# Patient Record
Sex: Female | Born: 1995 | Race: White | Hispanic: No | Smoking: Never smoker
Health system: Southern US, Community
[De-identification: ages and names within clinical notes are randomized; demographics above are authoritative.]

## PROBLEM LIST (undated history)

## (undated) DIAGNOSIS — F419 Anxiety disorder, unspecified: Secondary | ICD-10-CM

## (undated) DIAGNOSIS — M549 Dorsalgia, unspecified: Secondary | ICD-10-CM

## (undated) DIAGNOSIS — K589 Irritable bowel syndrome without diarrhea: Secondary | ICD-10-CM

## (undated) DIAGNOSIS — E079 Disorder of thyroid, unspecified: Secondary | ICD-10-CM

## (undated) DIAGNOSIS — I10 Essential (primary) hypertension: Secondary | ICD-10-CM

## (undated) DIAGNOSIS — K219 Gastro-esophageal reflux disease without esophagitis: Secondary | ICD-10-CM

## (undated) DIAGNOSIS — G4730 Sleep apnea, unspecified: Secondary | ICD-10-CM

## (undated) DIAGNOSIS — F32A Depression, unspecified: Secondary | ICD-10-CM

## (undated) DIAGNOSIS — R0602 Shortness of breath: Secondary | ICD-10-CM

## (undated) DIAGNOSIS — R5383 Other fatigue: Secondary | ICD-10-CM

## (undated) HISTORY — DX: Dorsalgia, unspecified: M54.9

## (undated) HISTORY — DX: Irritable bowel syndrome, unspecified: K58.9

## (undated) HISTORY — DX: Shortness of breath: R06.02

## (undated) HISTORY — DX: Essential (primary) hypertension: I10

## (undated) HISTORY — DX: Other fatigue: R53.83

## (undated) HISTORY — DX: Sleep apnea, unspecified: G47.30

## (undated) HISTORY — DX: Depression, unspecified: F32.A

## (undated) HISTORY — DX: Anxiety disorder, unspecified: F41.9

## (undated) HISTORY — DX: Gastro-esophageal reflux disease without esophagitis: K21.9

## (undated) HISTORY — DX: Disorder of thyroid, unspecified: E07.9

---
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
## 2013-07-13 ENCOUNTER — Ambulatory Visit (HOSPITAL_COMMUNITY)
Admission: RE | Admit: 2013-07-13 | Discharge: 2013-07-13 | Disposition: A | Discharge status: Home / Self Care | Payer: BC Managed Care – PPO | Source: Ambulatory Visit | Attending: Family Medicine | Admitting: Family Medicine

## 2013-07-13 ENCOUNTER — Ambulatory Visit (INDEPENDENT_AMBULATORY_CARE_PROVIDER_SITE_OTHER): Payer: BC Managed Care – PPO | Admitting: Family Medicine

## 2013-07-13 VITALS — BP 130/84 | HR 74 | Temp 98.0°F | Resp 16 | Ht 65.5 in | Wt 242.0 lb

## 2013-07-13 DIAGNOSIS — R0602 Shortness of breath: Secondary | ICD-10-CM | POA: Insufficient documentation

## 2013-07-13 DIAGNOSIS — R109 Unspecified abdominal pain: Secondary | ICD-10-CM

## 2013-07-13 DIAGNOSIS — R1033 Periumbilical pain: Secondary | ICD-10-CM | POA: Insufficient documentation

## 2013-07-13 LAB — POCT CBC
Granulocyte percent: 61.7 %G (ref 37–80)
HCT, POC: 46.4 % (ref 37.7–47.9)
Hemoglobin: 14.6 g/dL (ref 12.2–16.2)
Lymph, poc: 3.1 (ref 0.6–3.4)
MCH, POC: 29.6 pg (ref 27–31.2)
MCHC: 31.5 g/dL — AB (ref 31.8–35.4)
MCV: 94.2 fL (ref 80–97)
MID (CBC): 0.5 (ref 0–0.9)
MPV: 8 fL (ref 0–99.8)
PLATELET COUNT, POC: 333 10*3/uL (ref 142–424)
POC GRANULOCYTE: 5.8 (ref 2–6.9)
POC LYMPH %: 33.5 % (ref 10–50)
POC MID %: 4.8 %M (ref 0–12)
RBC: 4.93 M/uL (ref 4.04–5.48)
RDW, POC: 13.8 %
WBC: 9.4 10*3/uL (ref 4.6–10.2)

## 2013-07-13 LAB — POCT UA - MICROSCOPIC ONLY
Casts, Ur, LPF, POC: NEGATIVE
Crystals, Ur, HPF, POC: NEGATIVE
MUCUS UA: NEGATIVE
Yeast, UA: NEGATIVE

## 2013-07-13 LAB — POCT URINALYSIS DIPSTICK
BILIRUBIN UA: NEGATIVE
Glucose, UA: NEGATIVE
KETONES UA: NEGATIVE
LEUKOCYTES UA: NEGATIVE
Nitrite, UA: NEGATIVE
PH UA: 5.5
Protein, UA: NEGATIVE
Spec Grav, UA: >=1.03
Urobilinogen, UA: 0.2

## 2013-07-13 LAB — POCT URINE PREGNANCY: Preg Test, Ur: NEGATIVE

## 2013-07-13 MED ORDER — CYCLOBENZAPRINE HCL 5 MG PO TABS: 5.0000 mg | ORAL_TABLET | Freq: Three times a day (TID) | ORAL | Status: DC | PRN

## 2013-07-13 MED ORDER — IOHEXOL 300 MG/ML  SOLN
100.0000 mL | Freq: Once | INTRAMUSCULAR | Status: AC | PRN
  Administered 2013-07-13: 100 mL via INTRAVENOUS

## 2013-07-13 MED ORDER — HYDROCODONE-ACETAMINOPHEN 5-325 MG PO TABS: 1.0000 | ORAL_TABLET | Freq: Three times a day (TID) | ORAL | Status: DC | PRN

## 2013-07-13 NOTE — Addendum Note (Signed)
Addended by: Ethelda ChickSMITH, Jaala Bohle M on: 07/13/2013 09:07 PM   Modules accepted: Orders

## 2013-07-13 NOTE — Addendum Note (Signed)
Addended by: Ethelda ChickSMITH, Aliana Kreischer M on: 07/13/2013 09:10 PM   Modules accepted: Orders

## 2013-07-13 NOTE — Patient Instructions (Signed)
[facility] at Emergency Department as an OUTPATIENT CT. Make sure you tell them at the desk register for OUTPATIENT CT

## 2013-07-13 NOTE — Progress Notes (Signed)
Subjective:    Patient ID: Lori Clements, female    DOB: 10/14/1995, 18 y.o.   MRN: [id number]  Abdominal Pain Pertinent negatives include no constipation, diarrhea, dysuria, fever, frequency, hematuria, nausea or vomiting.   This 18 y.o. female presents for evaluation of abdominal pain.  No fever/chills/sweats.  No n/v/d/c.  No bloody stools; no melena.  +chronic heartburn; no worsening; no medications.  Eating has no affect on pain.  Appetite good.  No dysuria, frequency, hematuria.  Sporadic menses due to Nexplanon.  No similar pain in past.  Movement makes worse.  If jeans hit it; bending over makes worse; rolling over makes worse.  No medications for pain. Evaluated by school nurse today; no evidence of skin infection; nurse recommended Urgent Care evaluation.  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  Taking deep breath makes abdominal pain worse; pain located right at umbilicus.  No b.m. Today; last b.m. Yesterday and normal.    PCP: [facility]  Review of Systems  Constitutional: Negative for fever, chills and diaphoresis.  Gastrointestinal: Positive for abdominal pain. Negative for nausea, vomiting, diarrhea, constipation, blood in stool, anal bleeding and rectal pain.  Genitourinary: Negative for dysuria, urgency, frequency, hematuria and flank pain.  Skin: Negative for color change and rash.   Past Medical History  Diagnosis Date  . Thyroid disease    History reviewed. No pertinent past surgical history. Allergies  Allergen Reactions  . Naproxen     hematuria   No current outpatient prescriptions on file prior to visit.   No current facility-administered medications on file prior to visit.   History   Social History  . Marital Status: Single    Spouse Name: N/A    Number of Children: N/A  . Years of Education: N/A   Occupational History  . Not on file.   Social History Main Topics  . Smoking status: Never  Smoker   . Smokeless tobacco: Not on file  . Alcohol Use: No  . Drug Use: No  . Sexual Activity: Yes   Other Topics Concern  . Not on file   Social History Narrative  . No narrative on file       Objective:   Physical Exam  Nursing note and vitals reviewed. Constitutional: She appears well-developed and well-nourished. No distress.  obese  HENT:  Head: Normocephalic and atraumatic.  Neck: Normal range of motion. Neck supple.  Cardiovascular: Normal rate, regular rhythm and normal heart sounds.   Pulmonary/Chest: Effort normal and breath sounds normal.  Abdominal: Soft. Bowel sounds are normal. She exhibits no distension and no mass. There is no hepatosplenomegaly. There is tenderness in the periumbilical area. There is guarding. There is no rebound and no CVA tenderness. No hernia.    Exam limited due to pain.  Deep palpation of area not completed.  Skin: She is not diaphoretic. There is erythema.  +mild erythema 2 cm diameter superior to umbilicus without fluctuants, induration or skin breakdown; no vesicles or pustules.  No abnormality to skin in umbilicus.  Psychiatric: She has a normal mood and affect. Her behavior is normal.   Results for orders placed in visit on 07/13/13  POCT UA - MICROSCOPIC ONLY      Result Value Range   WBC, Ur, HPF, POC 4-5     RBC, urine, microscopic 6-11     Bacteria, U Microscopic 4+     Mucus, UA neg     Epithelial  cells, urine per micros 10-21     Crystals, Ur, HPF, POC neg     Casts, Ur, LPF, POC neg     Yeast, UA neg    POCT URINALYSIS DIPSTICK      Result Value Range   Color, UA yellow     Clarity, UA clear     Glucose, UA neg     Bilirubin, UA neg     Ketones, UA neg     Spec Grav, UA >=1.030     Blood, UA mod     pH, UA 5.5     Protein, UA neg     Urobilinogen, UA 0.2     Nitrite, UA neg     Leukocytes, UA Negative    POCT CBC      Result Value Range   WBC 9.4  4.6 - 10.2 K/uL   Lymph, poc 3.1  0.6 - 3.4   POC LYMPH  PERCENT 33.5  10 - 50 %L   MID (cbc) 0.5  0 - 0.9   POC MID % 4.8  0 - 12 %M   POC Granulocyte 5.8  2 - 6.9   Granulocyte percent 61.7  37 - 80 %G   RBC 4.93  4.04 - 5.48 M/uL   Hemoglobin 14.6  12.2 - 16.2 g/dL   HCT, POC 16.1  09.6 - 47.9 %   MCV 94.2  80 - 97 fL   MCH, POC 29.6  27 - 31.2 pg   MCHC 31.5 (*) 31.8 - 35.4 g/dL   RDW, POC 04.5     Platelet Count, POC 333  142 - 424 K/uL   MPV 8.0  0 - 99.8 fL  POCT URINE PREGNANCY      Result Value Range   Preg Test, Ur Negative         Assessment & Plan:  Abdominal pain - Plan: POCT UA - Microscopic Only, POCT urinalysis dipstick, POCT CBC, POCT urine pregnancy, CT Abdomen Pelvis W Contrast  1.  Abdominal pain periumbilical:  New. Ddx hernia, early appendicitis, cellulitis/abscess.  Obtain CT abdomen/pelvis due to severity of pain.    Nilda Simmer, M.D.  [facility] [address] [place], [place]  [address] [phone] phone [phone] fax

## 2013-07-14 ENCOUNTER — Ambulatory Visit: Payer: BC Managed Care – PPO | Admitting: Family Medicine

## 2013-07-14 VITALS — BP 110/70 | HR 65 | Temp 98.5°F | Resp 16 | Ht 65.0 in | Wt 242.0 lb

## 2013-07-14 DIAGNOSIS — L0291 Cutaneous abscess, unspecified: Secondary | ICD-10-CM

## 2013-07-14 DIAGNOSIS — L0390 Cellulitis, unspecified: Secondary | ICD-10-CM

## 2013-07-14 DIAGNOSIS — R1033 Periumbilical pain: Secondary | ICD-10-CM

## 2013-07-14 MED ORDER — SULFAMETHOXAZOLE-TMP DS 800-160 MG PO TABS: 1.0000 | ORAL_TABLET | Freq: Two times a day (BID) | ORAL | Status: DC

## 2013-07-14 MED ORDER — CEPHALEXIN 500 MG PO CAPS: 500.0000 mg | ORAL_CAPSULE | Freq: Three times a day (TID) | ORAL | Status: DC

## 2013-07-14 NOTE — Patient Instructions (Signed)
1.  Return for increasing pain, redness, swelling, or fever > 100.5 (chills, sweats).

## 2013-07-14 NOTE — Progress Notes (Signed)
Subjective:    Patient ID: Lori Clements, female    DOB: [birth year], 18 y.o.   MRN: [id number]  HPI This chart was scribed for Nakiesha Rumsey-MD, by Ladona Ridgelaylor Day, Scribe. This patient was seen in room 12 and the patient's care was started at 8:23 AM.  HPI Comments: Lori Clements is a 18 y.o. female who [facility] the [facility] for 24 hour follow up of her abdominal pain after she was seen yesterday for same. She is status post abdominal CT which was negative for abscess, appendicitis or hernia; no other abnormalities. She was advised to take Tylenol for pain and to follow up today. She also had a normal CBC and negative urine pregnancy test; u/a was negative.   She reports using melatonin and Tylenol to help her sleep last night. She reports abdominal pain is same as yesterday. She states rolling over in bed aggravates her pain. She denies any nausea, emesis episodes, diarrhea, fever/chills or sweats. She did not have a bm since yesterday AM, no black or bloody stools. She reports a normal appetite this AM. She states redness over area of her abdomen where she previously had pain is unchanged.  There are no active problems to display for this patient.  History reviewed. No pertinent past surgical history.  History reviewed. No pertinent family history.  History   Social History  . Marital Status: Single    Spouse Name: N/A    Number of Children: N/A  . Years of Education: N/A   Occupational History  . Not on file.   Social History Main Topics  . Smoking status: Never Smoker   . Smokeless tobacco: Not on file  . Alcohol Use: No  . Drug Use: No  . Sexual Activity: Yes   Other Topics Concern  . Not on file   Social History Narrative  . No narrative on file   Allergies  Allergen Reactions  . Naproxen     hematuria   Results for orders placed in visit on 07/13/13  POCT UA - MICROSCOPIC ONLY      Result Value Range   WBC, Ur, HPF, POC 4-5     RBC,  urine, microscopic 6-11     Bacteria, U Microscopic 4+     Mucus, UA neg     Epithelial cells, urine per micros 10-21     Crystals, Ur, HPF, POC neg     Casts, Ur, LPF, POC neg     Yeast, UA neg    POCT URINALYSIS DIPSTICK      Result Value Range   Color, UA yellow     Clarity, UA clear     Glucose, UA neg     Bilirubin, UA neg     Ketones, UA neg     Spec Grav, UA >=1.030     Blood, UA mod     pH, UA 5.5     Protein, UA neg     Urobilinogen, UA 0.2     Nitrite, UA neg     Leukocytes, UA Negative    POCT CBC      Result Value Range   WBC 9.4  4.6 - 10.2 K/uL   Lymph, poc 3.1  0.6 - 3.4   POC LYMPH PERCENT 33.5  10 - 50 %L   MID (cbc) 0.5  0 - 0.9   POC MID % 4.8  0 - 12 %M   POC Granulocyte 5.8  2 - 6.9  Granulocyte percent 61.7  37 - 80 %G   RBC 4.93  4.04 - 5.48 M/uL   Hemoglobin 14.6  12.2 - 16.2 g/dL   HCT, POC 21.3  08.6 - 47.9 %   MCV 94.2  80 - 97 fL   MCH, POC 29.6  27 - 31.2 pg   MCHC 31.5 (*) 31.8 - 35.4 g/dL   RDW, POC 57.8     Platelet Count, POC 333  142 - 424 K/uL   MPV 8.0  0 - 99.8 fL  POCT URINE PREGNANCY      Result Value Range   Preg Test, Ur Negative     Review of Systems  Constitutional: Negative for fever and chills.  Respiratory: Negative for cough and shortness of breath.   Cardiovascular: Negative for chest pain.  Gastrointestinal: Positive for abdominal pain. Negative for nausea, vomiting, diarrhea, constipation and blood in stool.  Musculoskeletal: Negative for back pain.      Objective:   Physical Exam  Nursing note and vitals reviewed. Constitutional: She is oriented to person, place, and time. She appears well-developed and well-nourished. No distress.  HENT:  Head: Normocephalic and atraumatic.  Eyes: Conjunctivae are normal. Right eye exhibits no discharge. Left eye exhibits no discharge.  Neck: Normal range of motion.  Cardiovascular: Normal rate.   Pulmonary/Chest: Effort normal. No respiratory distress.  Abdominal: Soft.  Bowel sounds are normal. She exhibits no distension and no mass. There is tenderness. There is guarding. There is no rebound.    +superficial TTP superior to umbilicus without induration, mass, fluctuants.    Musculoskeletal: Normal range of motion. She exhibits no edema.  Neurological: She is alert and oriented to person, place, and time.  Skin: Skin is warm and dry. There is erythema.  +2 cm erythema superior to umbilicus without induration, fluctuants.  Psychiatric: She has a normal mood and affect. Thought content normal.   Triage Vitals: BP 90/60  Pulse 65  Temp(Src) 98.5 F (36.9 C) (Oral)  Resp 16  Ht 5\' 5"  (1.651 m)  Wt 242 lb (109.77 kg)  BMI 40.27 kg/m2  SpO2 98%  LMP 07/11/2013  DIAGNOSTIC STUDIES: Oxygen Saturation is 98% on room air, normal by my interpretation.    COORDINATION OF CARE: At 825 AM Discussed treatment plan with patient which includes antibiotic therapy. Patient agrees.      Assessment & Plan:  Pain, abdominal, periumbilical  Cellulitis  1. Abdominal pain periumbilical:  Unchanged from yesterday; s/p CT negative for intraabdominal process.  Superficial tenderness suggestive of early cellulitis versus abscess.  Will treat with antibiotics. RTC immediately for fever, vomiting,diarrhea, worsening pain. 2. Cellulitis abdomen periumbilical: New. Rx for Keflex tid and Bactrim bid.  RTC for increasing redness, swelling, fever, pain.    I personally performed the services described in this documentation, which was scribed in my presence.  The recorded information has been reviewed and is accurate.  Nilda Simmer, M.D.  [facility] [address] [place], [place]  [address] [phone] phone [phone] fax

## 2013-12-29 ENCOUNTER — Ambulatory Visit: Payer: Self-pay

## 2018-07-01 ENCOUNTER — Ambulatory Visit (INDEPENDENT_AMBULATORY_CARE_PROVIDER_SITE_OTHER): Payer: BLUE CROSS/BLUE SHIELD | Admitting: Neurology

## 2018-07-01 ENCOUNTER — Encounter: Payer: Self-pay | Admitting: Neurology

## 2018-07-01 VITALS — BP 175/106 | HR 98 | Ht 66.0 in | Wt 260.0 lb

## 2018-07-01 DIAGNOSIS — R400 Somnolence: Secondary | ICD-10-CM

## 2018-07-01 DIAGNOSIS — R442 Other hallucinations: Secondary | ICD-10-CM

## 2018-07-01 DIAGNOSIS — R51 Headache: Secondary | ICD-10-CM

## 2018-07-01 DIAGNOSIS — Z6841 Body Mass Index (BMI) 40.0 and over, adult: Secondary | ICD-10-CM

## 2018-07-01 DIAGNOSIS — R351 Nocturia: Secondary | ICD-10-CM

## 2018-07-01 DIAGNOSIS — G478 Other sleep disorders: Secondary | ICD-10-CM | POA: Diagnosis not present

## 2018-07-01 DIAGNOSIS — R519 Headache, unspecified: Secondary | ICD-10-CM

## 2018-07-01 DIAGNOSIS — R0683 Snoring: Secondary | ICD-10-CM

## 2018-07-01 NOTE — Progress Notes (Signed)
Subjective:    Patient ID: Lori Clements is a 23 y.o. female.  HPI     Huston FoleySaima Shunte Senseney, MD, PhD [facility] [address], [address] [address] [place], [place] [address]  Dear Dr. Egbert GaribaldiSlatosky,   I saw your patient, Lori Clements, upon your kind request in my sleep clinic today for initial consultation of her sleep disorder, in particular, significant daytime somnolence. The patient is unaccompanied today. As you know, Lori Clements is a 23 year old right-handed woman with an underlying medical history of anxiety and morbid obesity with BMI of over 40, who reports excessive daytime somnolence for the past 2+ years. She remembers being sleepy in college. She sometimes does talk while in class. She has come very close to falling asleep at the wheel. Once a week she has to take a longer distance drive and has to pull over to take a nap. She has noticed that she starts dreaming and her and she has had vivid dreams and nightmares. She has had no cognitive hallucinations and that she feels like she started streaming before she is even sleep. She has had infrequent episodes of sleep paralysis in the past 2 years. She does not recall being sleepy child. She was easy to wake up in the mornings as she recalls. She tries to get at least 7 hours of sleep, her sleep schedule varies because of her work schedule. 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 I reviewed your office note from 06/12/2018, which you kindly included. She does report snoring. Epworth Sleepiness Scale score is 19 out of 24 today, fatigue score is 16 out of 63. She is married and lives with her husband, she has no children, she works at [employer]. She is a nonsmoker and drinks alcohol occasionally, maybe once a month or so, caffeine not daily, typically once or twice a week or so. Of note, she has been on sertraline, currently 75 mg daily. Bedtime is between 7 PM and 10 PM typically depending on her work schedule  and as late as 11. Rise time is as early as 3:30 AM or as late as knowing. She has 3 dogs and 2 cats in the household, dog sleep in their crates, cats are in the bedroom sometimes. She has a TV in the bedroom but does not utilize it. Her maternal grandmother has sleep apnea, there is no family history of narcolepsy. She denies telltale symptoms of cataplexy. Her husband has noticed her sleepiness, she has been told that she snores some. She had a colonoscopy recently and was not told that there were any issues with her breathing or oxygen level. Her weight has been fluctuating. Recently she was able to lose several pounds but gained most of it back. She has nocturia about once per average night and has had the occasional morning headache and a tendency towards migrainous headaches around her periods.  Her Past Medical History Is Significant For: Past Medical History:  Diagnosis Date  . Thyroid disease     Her Past Surgical History Is Significant For: No past surgical history on file.  Her Family History Is Significant For: No family history on file.  Her Social History Is Significant For: Social History   Socioeconomic History  . Marital status: Single    Spouse name: Not on file  . Number of children: Not on file  . Years of education: Not on file  . Highest education level: Not on file  Occupational History  .  Not on file  Social Needs  . Financial resource strain: Not on file  . Food insecurity:    Worry: Not on file    Inability: Not on file  . Transportation needs:    Medical: Not on file    Non-medical: Not on file  Tobacco Use  . Smoking status: Never Smoker  . Smokeless tobacco: Never Used  Substance and Sexual Activity  . Alcohol use: No  . Drug use: No  . Sexual activity: Yes  Lifestyle  . Physical activity:    Days per week: Not on file    Minutes per session: Not on file  . Stress: Not on file  Relationships  . Social connections:    Talks on phone: Not on  file    Gets together: Not on file    Attends religious service: Not on file    Active member of club or organization: Not on file    Attends meetings of clubs or organizations: Not on file    Relationship status: Not on file  Other Topics Concern  . Not on file  Social History Narrative  . Not on file    Her Allergies Are:  Allergies  Allergen Reactions  . Naproxen     hematuria  :   Her Current Medications Are:  Outpatient Encounter Medications as of 07/01/2018  Medication Sig  . etonogestrel-ethinyl estradiol (NUVARING) 0.12-0.015 MG/24HR vaginal ring Place 1 each vaginally every 28 (twenty-eight) days. Insert vaginally and leave in place for 3 consecutive weeks, then remove for 1 week.  Marland Kitchen RANITIDINE HCL PO Take by mouth.  . sertraline (ZOLOFT) 50 MG tablet Take 75 mg by mouth daily.  . [DISCONTINUED] cephALEXin (KEFLEX) 500 MG capsule Take 1 capsule (500 mg total) by mouth 3 (three) times daily.  . [DISCONTINUED] etonogestrel (IMPLANON) 68 MG IMPL implant Inject 1 each into the skin once.  . [DISCONTINUED] levothyroxine (SYNTHROID, LEVOTHROID) 50 MCG tablet Take 50 mcg by mouth daily before breakfast.  . [DISCONTINUED] sulfamethoxazole-trimethoprim (BACTRIM DS) 800-160 MG per tablet Take 1 tablet by mouth 2 (two) times daily.   No facility-administered encounter medications on file as of 07/01/2018.   :  Review of Systems:  Out of a complete 14 point review of systems, all are reviewed and negative with the exception of these symptoms as listed below: Review of Systems  Neurological:       Pt presents today to discuss her sleep. Pt does endorse snoring but has never had a sleep study. Pt is concerned about her excessive sleep.  Epworth Sleepiness Scale 0= would never doze 1= slight chance of dozing 2= moderate chance of dozing 3= high chance of dozing  Sitting and reading: 3 Watching TV: 2 Sitting inactive in a public place (ex. Theater or meeting): 2 As a passenger in  a car for an hour without a break: 3 Lying down to rest in the afternoon: 3 Sitting and talking to someone: 0 Sitting quietly after lunch (no alcohol): 3 In a car, while stopped in traffic: 3 Total: 19     Objective:  Neurological Exam  Physical Exam Physical Examination:   Vitals:   07/01/18 1314  BP: (!) 175/106  Pulse: 98    General Examination: The patient is a very pleasant 23 y.o. female in no acute distress. She appears well-developed and well-nourished and well groomed.   HEENT: Normocephalic, atraumatic, pupils are equal, round and reactive to light and accommodation. Extraocular tracking is good without limitation to  gaze excursion or nystagmus noted. Normal smooth pursuit is noted. Hearing is grossly intact. Tympanic membranes are clear bilaterally. Face is symmetric with normal facial animation and normal facial sensation. Speech is clear with no dysarthria noted. There is no hypophonia. There is no lip, neck/head, jaw or voice tremor. Neck is supple with full range of passive and active motion. There are no carotid bruits on auscultation. Oropharynx exam reveals: mild mouth dryness, good dental hygiene and mild to moderate airway crowding, due to tonsils of 2+, smaller airway entry. Mallampati is class I. Uvula is on the smaller side. Tongue protrudes centrally and palate elevates symmetrically. Neck circumference is 15-5/8 inches.  Chest: Clear to auscultation without wheezing, rhonchi or crackles noted.  Heart: S1+S2+0, regular and normal without murmurs, rubs or gallops noted.   Abdomen: Soft, non-tender and non-distended with normal bowel sounds appreciated on auscultation.  Extremities: There is no pitting edema in the distal lower extremities bilaterally.   Skin: Warm and dry without trophic changes noted.  Musculoskeletal: exam reveals no obvious joint deformities, tenderness or joint swelling or erythema.   Neurologically:  Mental status: The patient is  awake, alert and oriented in all 4 spheres. Her immediate and remote memory, attention, language skills and fund of knowledge are appropriate. There is no evidence of aphasia, agnosia, apraxia or anomia. Speech is clear with normal prosody and enunciation. Thought process is linear. Mood is normal and affect is normal.  Cranial nerves II - XII are as described above under HEENT exam. In addition: shoulder shrug is normal with equal shoulder height noted. Motor exam: Normal bulk, strength and tone is noted. There is no drift, tremor or rebound. Romberg is negative. Reflexes are 2+ throughout. Fine motor skills and coordination: intact with normal finger taps, normal hand movements, normal rapid alternating patting, normal foot taps and normal foot agility.  Cerebellar testing: No dysmetria or intention tremor on finger to nose testing. Heel to shin is unremarkable bilaterally. There is no truncal or gait ataxia.  Sensory exam: intact to light touch in the upper and lower extremities.  Gait, station and balance: She stands easily. No veering to one side is noted. No leaning to one side is noted. Posture is age-appropriate and stance is narrow based. Gait shows normal stride length and normal pace. No problems turning are noted. Tandem walk is unremarkable.               Assessment and Plan:  In summary, Takiesha Sibyl Parr is a very pleasant 23 y.o.-year old female with an underlying medical history of anxiety and morbid obesity, who presents for evaluation of her excessive daytime somnolence of at least 2 years duration. She has had some infrequent episodes of sleep paralysis, denies any telltale symptoms of cataplexy but has had vivid dreams, possibly is hypnagogic hallucinations. She also notes that she has had some sleep talking but no sleepwalking. She does snore to some degree and given her obesity and mild to moderately crowded airway and family history of sleep apnea she may be at risk for this.  Nevertheless, extended sleep testing is indicated in the form of PSG and next a MSLT. I explained the sleep study procedures to her. In order to proceed with sleep study testing with accurate and reliable results, she would have to taper off her sertraline. She is encouraged to talk to you about this, she would have to be off of her antidepressant about 2-3 weeks prior to sleep study testing. She admits that  she has tried her sister's Adderall once or twice before and was able to function better and make it through the day without becoming sleepy. She is aware that she should not be taking her sister's medication. Physical and neurological exam are nonfocal. We also talked about sleep apnea and treatment options for this. We will pick up our discussion after sleep study testing is completed and I plan to see her back after testing. She is strongly advised not to drive when feeling sleepy. I answered all her questions today and she was in agreement.  Thank you very much for allowing me to participate in the care of this nice patient. If I can be of any further assistance to you please do not hesitate to call me at [phone].  Sincerely,   Huston FoleySaima Kimberlly Norgard, MD, PhD

## 2018-07-01 NOTE — Patient Instructions (Signed)
I believe you may have an underlying sleepiness condition. This means, that you have a sleep disorder that manifests with excessive sleep and excessive sleepiness during the day.  We will do testing testing at this point: I would like for you to come back for an overnight sleep study during which we will monitor your night time sleep and we will do nap study testing the next day: 5 scheduled 20 min nap opportunities, every 2 hours. We will remind you to stay awake in between naps.   As explained, you will have to be off of your antidepressant medication in preparation for the sleep studies. You will have to talk to Dr. Egbert Garibaldi about tapering the sertraline and be off of it at least 2 weeks, better yet 3 weeks.

## 2018-08-03 ENCOUNTER — Ambulatory Visit (INDEPENDENT_AMBULATORY_CARE_PROVIDER_SITE_OTHER): Payer: BLUE CROSS/BLUE SHIELD | Admitting: Neurology

## 2018-08-03 DIAGNOSIS — G4733 Obstructive sleep apnea (adult) (pediatric): Secondary | ICD-10-CM | POA: Diagnosis not present

## 2018-08-03 DIAGNOSIS — R351 Nocturia: Secondary | ICD-10-CM

## 2018-08-03 DIAGNOSIS — R442 Other hallucinations: Secondary | ICD-10-CM

## 2018-08-03 DIAGNOSIS — R400 Somnolence: Secondary | ICD-10-CM

## 2018-08-03 DIAGNOSIS — Z6841 Body Mass Index (BMI) 40.0 and over, adult: Secondary | ICD-10-CM

## 2018-08-03 DIAGNOSIS — G478 Other sleep disorders: Secondary | ICD-10-CM

## 2018-08-03 DIAGNOSIS — R0683 Snoring: Secondary | ICD-10-CM

## 2018-08-03 DIAGNOSIS — R519 Headache, unspecified: Secondary | ICD-10-CM

## 2018-08-03 DIAGNOSIS — R51 Headache: Secondary | ICD-10-CM

## 2018-08-06 ENCOUNTER — Telehealth: Payer: Self-pay

## 2018-08-06 NOTE — Telephone Encounter (Signed)
I called pt, she is at work, will call me back this afternoon.

## 2018-08-06 NOTE — Procedures (Signed)
PATIENT'S NAME:  Lori Clements DOB:      1995-08-09      MR#:    [id number]     DATE OF RECORDING: 08/03/2018 REFERRING M.D.:  Cheri Rous, MD Study Performed:   Baseline Polysomnogram HISTORY: 23 year old woman with a history of anxiety and morbid obesity with BMI of over 40, who reports excessive daytime somnolence for the past 2+ years. Her Epworth Sleepiness score is 19 out of 24. The patient's weight 260 pounds with a height of 66 (inches), resulting in a BMI of 41.8 kg/m2. The patient's neck circumference measured 15.5 inches.  CURRENT MEDICATIONS: Nuvaring, Ranitidine, Zoloft,   PROCEDURE:  This is a multichannel digital polysomnogram utilizing the Somnostar 11.2 system.  Electrodes and sensors were applied and monitored per AASM Specifications.   EEG, EOG, Chin and Limb EMG, were sampled at 200 Hz.  ECG, Snore and Nasal Pressure, Thermal Airflow, Respiratory Effort, CPAP Flow and Pressure, Oximetry was sampled at 50 Hz. Digital video and audio were recorded.      BASELINE STUDY - the patient was scheduled to have a next day MSLT, but this was canceled on the basis of underlying OSA.  Lights Out was at 20:58 and Lights On at 05:35.  Total recording time (TRT) was 517 minutes, with a total sleep time (TST) of 503 minutes.   The patient's sleep latency was 11 minutes.  REM latency was 147 minutes, which is delayed. The sleep efficiency was 97.3 %.     SLEEP ARCHITECTURE: WASO (Wake after sleep onset) was 6.5 minutes with no significant sleep fragmentation noted. There were 1.5 minutes in Stage N1, 330.5 minutes Stage N2, 78 minutes Stage N3 and 93 minutes in Stage REM.  The percentage of Stage N1 was .3%, Stage N2 was 65.7%, which is increased, Stage N3 was 15.5%, which is normal, and Stage R (REM sleep) was 18.5%, which is near-normal. The arousals were noted as: 102 were spontaneous, 0 were associated with PLMs, 25 were associated with respiratory events.   RESPIRATORY ANALYSIS:  There were  a total of 85 respiratory events:  3 obstructive apneas, 1 central apneas and 1 mixed apneas with a total of 5 apneas and an apnea index (AI) of .6 /hour. There were 80 hypopneas with a hypopnea index of 9.5 /hour. The patient also had 0 respiratory event related arousals (RERAs).      The total APNEA/HYPOPNEA INDEX (AHI) was 10.1/hour and the total RESPIRATORY DISTURBANCE INDEX was 10.1 /hour.  42 events occurred in REM sleep and 82 events in NREM. The REM AHI was 27.1 /hour, versus a non-REM AHI of 6.3. The patient spent 386 minutes of total sleep time in the supine position and 117 minutes in non-supine.. The supine AHI was 12.9 versus a non-supine AHI of 1.0.  OXYGEN SATURATION & C02:  The Wake baseline 02 saturation was 98%, with the lowest being 89%. Time spent below 89% saturation equaled 0 minutes.  PERIODIC LIMB MOVEMENTS: The patient had a total of 0 Periodic Limb Movements.  The Periodic Limb Movement (PLM) index was 0 and the PLM Arousal index was 0/hour.  Audio and video analysis did not show any abnormal or unusual movements, behaviors, phonations or vocalizations. The patient took no bathroom breaks. Mild to moderate snoring was noted. The EKG was in keeping with normal sinus rhythm (NSR).  Post-study, the patient indicated that sleep was the same as usual.   IMPRESSION:  1. Obstructive Sleep Apnea (OSA)  RECOMMENDATIONS:  1. This  study demonstrates overall mild obstructive sleep apnea, severe in REM sleep with a total AHI of 10.1/hour, REM AHI of 27.1/hour, and O2 nadir of 89%. Given the patient's medical history and sleep related complaints, treatment with positive airway pressure is recommended; this can be achieved in the form of autoPAP. Alternatively, a full-night CPAP titration study would allow optimization of therapy, if needed. Other treatment options may include avoidance of supine sleep position along with weight loss, upper airway or jaw surgery in selected patients or  the use of an oral appliance in certain patients. ENT evaluation and/or consultation with a maxillofacial surgeon or dentist may be feasible in some instances. Weight loss is recommended in conjunction with autoPAP.  2. Please note that untreated obstructive sleep apnea may carry additional perioperative morbidity. Patients with significant obstructive sleep apnea should receive perioperative PAP therapy and the surgeons and particularly the anesthesiologist should be informed of the diagnosis and the severity of the sleep disordered breathing. 3. The patient should be cautioned not to drive, work at heights, or operate dangerous or heavy equipment when tired or sleepy. Review and reiteration of good sleep hygiene measures should be pursued with any patient. The patient did not stay for next day nap testing due to OSA diagnosis. She will be advised to start treatment for sleep apnea.   4. The patient will be [facility] for discussion of the test results and further management strategies. The referring provider will be notified of the test results.  I certify that I have reviewed the entire raw data recording prior to the issuance of this report in accordance with the Standards of Accreditation of the American Academy of Sleep Medicine (AASM)   Huston FoleySaima Shone Leventhal, MD, PhD Diplomat, American Board of Neurology and Sleep Medicine (Neurology and Sleep Medicine)

## 2018-08-06 NOTE — Telephone Encounter (Signed)
-----   Message from Saima Athar, MD sent at 08/06/2018  8:21 AM EST --Lori Clements--- Patient referred by Dr. Egbert GaribaldiSlatosky, seen by me on 07/01/18, diagnostic PSG on 08/03/18, did not stay for MSLT d/t OSA diagnosis.    Please call and notify the patient that the recent sleep study did confirm the diagnosis of obstructive sleep apnea. OSA is overall mild, moderate (by number of events) in REM sleep with a total AHI of 10.1/hour, REM AHI of 27.1/hour, and O2 nadir of 89%. (btw: I meant to say moderate in REM sleep in the report and corrected it later in the system, but it says severe in REM in the epic report). I would recommend treatment with autoPAP, so see if her sleepiness gets better. This means, that we don't have to bring her back for a second sleep study with CPAP, but will let her try an autoPAP machine at home, through a DME company (of her choice, or as per insurance requirement). The DME representative will educate her on how to use the machine, how to put the mask on, etc. I have placed an order in the chart. Please send referral, talk to patient, send report to referring MD. We will need a FU in sleep clinic for 10 weeks post-PAP set up, please arrange that with me or one of our NPs. Thanks,   Lori FoleySaima Athar, MD, PhD [facility])

## 2018-08-06 NOTE — Progress Notes (Signed)
Patient referred by Dr. Egbert Garibaldi, seen by me on 07/01/18, diagnostic PSG on 08/03/18, did not stay for MSLT d/t OSA diagnosis.    Please call and notify the patient that the recent sleep study did confirm the diagnosis of obstructive sleep apnea. OSA is overall mild, moderate (by number of events) in REM sleep with a total AHI of 10.1/hour, REM AHI of 27.1/hour, and O2 nadir of 89%. (btw: I meant to say moderate in REM sleep in the report and corrected it later in the system, but it says severe in REM in the epic report). I would recommend treatment with autoPAP, so see if her sleepiness gets better. This means, that we don't have to bring her back for a second sleep study with CPAP, but will let her try an autoPAP machine at home, through a DME company (of her choice, or as per insurance requirement). The DME representative will educate her on how to use the machine, how to put the mask on, etc. I have placed an order in the chart. Please send referral, talk to patient, send report to referring MD. We will need a FU in sleep clinic for 10 weeks post-PAP set up, please arrange that with me or one of our NPs. Thanks,   Huston Foley, MD, PhD [facility])

## 2018-08-06 NOTE — Addendum Note (Signed)
Addended by: Huston FoleyATHAR, Mckensey Berghuis on: 08/06/2018 08:21 AM   Modules accepted: Orders

## 2018-08-10 NOTE — Telephone Encounter (Signed)
Pt returned my call. I advised pt that Dr. Frances Furbish reviewed their sleep study results and found that pt has osa, mild overall, moderate in REM sleep. Dr. Frances Furbish recommends that pt start an auto pap at home. I reviewed PAP compliance expectations with the pt. Pt is agreeable to starting an auto-PAP. I advised pt that an order will be sent to a DME, [facility], and [facility] will call the pt within about one week after they file with the pt's insurance. [facility] will show the pt how to use the machine, fit for masks, and troubleshoot the auto-PAP if needed. A follow up appt was made for insurance purposes with Dr. Frances Furbish on 11/12/2018 at 1:00pm. Pt verbalized understanding to arrive 15 minutes early and bring their auto-PAP. A letter with all of this information in it will be mailed to the pt as a reminder. I verified with the pt that the address we have on file is correct. Pt verbalized understanding of results. Pt had no questions at this time but was encouraged to call back if questions arise. I have sent the order to [facility] and have received confirmation that they have received the order.

## 2018-11-04 ENCOUNTER — Telehealth: Payer: Self-pay | Admitting: Neurology

## 2018-11-04 NOTE — Telephone Encounter (Signed)
Due to current COVID 19 pandemic, our office is severely reducing in office visits until further notice, in order to minimize the risk to our patients and healthcare providers.   Called patient to offer virtual visit for 5/21 appt. Patient states that she agrees to a virtual visit but wants to reschedule to 5/26. She is now scheduled for 5/26 at 10 AM. Patient verbalized understanding of the doxy process. I have sent patient an e-mail with link and instructions, as well as my name and office number/hours. Patient understands that she will receive a call from RN prior to appt.  Pt understands that although there may be some limitations with this type of visit, we will take all precautions to reduce any security or privacy concerns.  Pt understands that this will be treated like an in office visit and we will file with pt's insurance, and there may be a patient responsible charge related to this service.

## 2018-11-11 NOTE — Telephone Encounter (Signed)
I called pt. Pt's meds, allergies, and PMH were updated.  Pt reports that she takes off her auto pap unknowingly in the night. She has not noticed much difference her nap frequency.   Pt confirmed that she received the [email or web address] link and understands that process.

## 2018-11-12 ENCOUNTER — Ambulatory Visit: Payer: Self-pay | Admitting: Neurology

## 2018-11-17 ENCOUNTER — Encounter: Payer: Self-pay | Admitting: Neurology

## 2018-11-17 ENCOUNTER — Ambulatory Visit (INDEPENDENT_AMBULATORY_CARE_PROVIDER_SITE_OTHER): Payer: BLUE CROSS/BLUE SHIELD | Admitting: Neurology

## 2018-11-17 ENCOUNTER — Other Ambulatory Visit: Payer: Self-pay

## 2018-11-17 DIAGNOSIS — Z9989 Dependence on other enabling machines and devices: Secondary | ICD-10-CM

## 2018-11-17 DIAGNOSIS — G4710 Hypersomnia, unspecified: Secondary | ICD-10-CM

## 2018-11-17 DIAGNOSIS — G4730 Sleep apnea, unspecified: Secondary | ICD-10-CM

## 2018-11-17 DIAGNOSIS — G4733 Obstructive sleep apnea (adult) (pediatric): Secondary | ICD-10-CM

## 2018-11-17 NOTE — Progress Notes (Signed)
Interim history:  Lori Clements is a 23 year old right-handed woman with an underlying medical history of anxiety and morbid obesity with BMI of over 40, who presents for a virtual, video based appointment via [email or web address] for follow-up consultation of her sleep apnea and daytime somnolence.  The patient is unaccompanied today and joins via cell phone from home, I am located in my office.  I first met her on 07/01/2018 at the request of her primary care physician, at which time she reported a 2+ year history of excessive daytime somnolence.  Her Epworth sleepiness score was 19 at the time.  I suggested we proceed with sleep study testing in the form of diagnostic polysomnogram with next day MSLT.  She had a baseline sleep study on 08/03/2018 which showed a sleep efficiency of 97.3%, sleep latency of 11 minutes, REM latency delayed at 147 minutes.  She had a normal percentage of slow-wave sleep and a normal percentage at 18.5% of REM sleep.  Her total AHI was 10.1/h, REM AHI in the moderate range at 27.1/h, supine AHI 12.9/h.  Average oxygen saturation was 98%, nadir was 89%.  She had no significant PLM's.  Based on her test results she was advised to proceed with AutoPap therapy for her sleep apnea and we did not pursue the MSLT at the time.  Today, 11/17/2018: Please also see below for virtual visit documentation.  I reviewed her AutoPap compliance data from 10/12/2018 through 11/10/2018 which is a total of 30 days, during which time she used her machine 27 days with percent used days greater than 4 hours at 67%, indicating slightly suboptimal compliance with an average usage of 3 hours and 53 minutes, residual AHI at goal at 0.1/h, 95th percentile of pressure of 9.1 cm, leak on the low side with a 95th percentile at 2.8 L/min on a pressure range of 5 cm to 11 cm with EPR.   Previously:   07/01/2018: (She) reports excessive daytime somnolence for the past 2+ years. She remembers being sleepy in college. She  sometimes dosed off while in class. She has come very close to falling asleep at the wheel. Once a week she has to take a longer distance drive and has to pull over to take a nap. She has noticed that she starts dreaming and her and she has had vivid dreams and nightmares. She has had no cognitive hallucinations and that she feels like she started dreaming before she is even sleep. She has had infrequent episodes of sleep paralysis in the past 2 years. She does not recall being sleepy child. She was easy to wake up in the mornings as she recalls. She tries to get at least 7 hours of sleep, her sleep schedule varies because of her work schedule. She is also pursuing her masters degree in social work. I reviewed your office note from 06/12/2018, which you kindly included. She does report snoring. Epworth Sleepiness Scale score is 19 out of 24 today, fatigue score is 16 out of 63. She is married and lives with her husband, she has no children, she works at [employer]. She is a nonsmoker and drinks alcohol occasionally, maybe once a month or so, caffeine not daily, typically once or twice a week or so. Of note, she has been on sertraline, currently 75 mg daily. Bedtime is between 7 PM and 10 PM typically depending on her work schedule and as late as 96. Rise time is as early as 3:30 AM or as late as  knowing. She has 3 dogs and 2 cats in the household, dog sleep in their crates, cats are in the bedroom sometimes. She has a TV in the bedroom but does not utilize it. Her maternal grandmother has sleep apnea, there is no family history of narcolepsy. She denies telltale symptoms of cataplexy. Her husband has noticed her sleepiness, she has been told that she snores some. She had a colonoscopy recently and was not told that there were any issues with her breathing or oxygen level. Her weight has been fluctuating. Recently she was able to lose several pounds but gained most of it back. She has nocturia about once per  average night and has had the occasional morning headache and a tendency towards migrainous headaches around her periods.  Her Past Medical History Is Significant For: Past Medical History:  Diagnosis Date   Thyroid disease     Her Past Surgical History Is Significant For: No past surgical history on file.  Her Family History Is Significant For: No family history on file.  Her Social History Is Significant For: Social History   Socioeconomic History   Marital status: Single    Spouse name: Not on file   Number of children: Not on file   Years of education: Not on file   Highest education level: Not on file  Occupational History   Not on file  Social Needs   Financial resource strain: Not on file   Food insecurity:    Worry: Not on file    Inability: Not on file   Transportation needs:    Medical: Not on file    Non-medical: Not on file  Tobacco Use   Smoking status: Never Smoker   Smokeless tobacco: Never Used  Substance and Sexual Activity   Alcohol use: No   Drug use: No   Sexual activity: Yes  Lifestyle   Physical activity:    Days per week: Not on file    Minutes per session: Not on file   Stress: Not on file  Relationships   Social connections:    Talks on phone: Not on file    Gets together: Not on file    Attends religious service: Not on file    Active member of club or organization: Not on file    Attends meetings of clubs or organizations: Not on file    Relationship status: Not on file  Other Topics Concern   Not on file  Social History Narrative   Not on file    Her Allergies Are:  Allergies  Allergen Reactions   Naproxen     hematuria  :   Her Current Medications Are:  Outpatient Encounter Medications as of 11/17/2018  Medication Sig   etonogestrel-ethinyl estradiol (NUVARING) 0.12-0.015 MG/24HR vaginal ring Place 1 each vaginally every 28 (twenty-eight) days. Insert vaginally and leave in place for 3 consecutive  weeks, then remove for 1 week.   RANITIDINE HCL PO Take by mouth.   sertraline (ZOLOFT) 50 MG tablet Take 75 mg by mouth daily.   No facility-administered encounter medications on file as of 11/17/2018.   :  Review of Systems:  Out of a complete 14 point review of systems, all are reviewed and negative with the exception of these symptoms as listed below:  Virtual Visit via Video Note on @ TODAY@  I connected with@ on 11/17/18 at 10:00 AM EDT by a video enabled telemedicine application and verified that I am speaking with the correct person using two  identifiers.   I discussed the limitations of evaluation and management by telemedicine and the availability of in person appointments. The patient expressed understanding and agreed to proceed.  History of Present Illness: She reports That she is trying to use her AutoPap but has a tendency to pull it off at night, she has not been able to consistently sleep with it.  She does not mind the nasal interface.  She is using the dream wear nasal cradle.  The pressure does not seem to bother her but she does not realize that nearly every night she pulls it off and wakes up with it off her face.  She has not really benefited from treatment quite yet but is willing to continue to use her AutoPap and try to be consistent.  She is working on weight loss.  She is currently not working, has been furloughed.   Observations/Objective:  There are no recent vital signs available for my review in her chart.  The most recent vital signs are from January 2020. On examination, she is very pleasant and conversant, in no acute distress, good comprehension and language skills, speech is clear without dysarthria, hypophonia or voice tremor noted.  She has no lip, neck or jaw tremor, face is symmetric with normal facial animation.  Airway examination revealed stable results, mild mouth dryness noted, tongue protrudes centrally in palate elevates symmetrically.  Hearing  is grossly intact, upper body mobility, muscle bulk and coordination are grossly intact.  Assessment and Plan: In summary, Lori Clements is a very pleasant 23 year old female with an underlying medical history of anxiety and morbid obesity, who presents for A virtual, video based appointment via [email or web address] for follow-up consultation of her excessive daytime somnolence of at least 2 years duration.  She had a baseline sleep study in February 2020 which indicated mild obstructive sleep apnea.  We did not keep her for next a nap testing for that reason.  She has been established on AutoPap therapy but is not quite there yet in terms of her ability to use it consistently every night through the night and repeat benefit from treatment.  She is willing to continue with treatment.  She is strongly encouraged to continue to work on weight loss as this may help as well.  If needed down the road, we may need to bring her back for extended sleep study testing but for now we mutually agreed to continue with AutoPap therapy for her mild sleep apnea in the hope that she will feel better in terms of her daytime somnolence once she is able to use it consistently through the night.  I suggested a reevaluation with a in office follow-up with 1 of our nurse practitioners in 3 months, sooner if needed.  I answered all her questions today and she was in agreement. She is again advised not to drive when feeling sleepy.  Follow Up Instructions:    I discussed the assessment and treatment plan with the patient. The patient was provided an opportunity to ask questions and all were answered. The patient agreed with the plan and demonstrated an understanding of the instructions.   The patient was advised to call back or seek an in-person evaluation if the symptoms worsen or if the condition fails to improve as anticipated.  I provided 25 minutes of non-face-to-face time during this encounter.   Star Age, MD

## 2018-11-17 NOTE — Patient Instructions (Signed)
Given verbally, during today's virtual video-based encounter, with verbal feedback received.   

## 2018-11-23 ENCOUNTER — Telehealth: Payer: Self-pay

## 2018-11-23 NOTE — Telephone Encounter (Signed)
I called pt, scheduled her 3 mo follow up with MM. Pt verbalized understanding of new appt date and time.

## 2019-03-18 ENCOUNTER — Ambulatory Visit: Payer: Self-pay | Admitting: Adult Health

## 2021-08-08 DIAGNOSIS — Z0289 Encounter for other administrative examinations: Secondary | ICD-10-CM

## 2021-08-13 ENCOUNTER — Encounter (INDEPENDENT_AMBULATORY_CARE_PROVIDER_SITE_OTHER): Payer: Self-pay

## 2021-08-16 DIAGNOSIS — R7989 Other specified abnormal findings of blood chemistry: Secondary | ICD-10-CM

## 2021-08-16 HISTORY — DX: Other specified abnormal findings of blood chemistry: R79.89

## 2021-08-21 ENCOUNTER — Other Ambulatory Visit: Payer: Self-pay

## 2021-08-21 ENCOUNTER — Encounter (INDEPENDENT_AMBULATORY_CARE_PROVIDER_SITE_OTHER): Payer: Self-pay | Admitting: Family Medicine

## 2021-08-21 ENCOUNTER — Ambulatory Visit (INDEPENDENT_AMBULATORY_CARE_PROVIDER_SITE_OTHER): Payer: Managed Care, Other (non HMO) | Admitting: Family Medicine

## 2021-08-21 VITALS — BP 133/84 | HR 78 | Temp 97.9°F | Ht 66.0 in | Wt 335.0 lb

## 2021-08-21 DIAGNOSIS — K219 Gastro-esophageal reflux disease without esophagitis: Secondary | ICD-10-CM

## 2021-08-21 DIAGNOSIS — G4733 Obstructive sleep apnea (adult) (pediatric): Secondary | ICD-10-CM

## 2021-08-21 DIAGNOSIS — R0602 Shortness of breath: Secondary | ICD-10-CM

## 2021-08-21 DIAGNOSIS — I10 Essential (primary) hypertension: Secondary | ICD-10-CM

## 2021-08-21 DIAGNOSIS — F39 Unspecified mood [affective] disorder: Secondary | ICD-10-CM

## 2021-08-21 DIAGNOSIS — Z9989 Dependence on other enabling machines and devices: Secondary | ICD-10-CM

## 2021-08-21 DIAGNOSIS — Z9189 Other specified personal risk factors, not elsewhere classified: Secondary | ICD-10-CM

## 2021-08-21 DIAGNOSIS — F5089 Other specified eating disorder: Secondary | ICD-10-CM | POA: Diagnosis not present

## 2021-08-21 DIAGNOSIS — Z1331 Encounter for screening for depression: Secondary | ICD-10-CM

## 2021-08-21 DIAGNOSIS — E119 Type 2 diabetes mellitus without complications: Secondary | ICD-10-CM

## 2021-08-21 DIAGNOSIS — R5383 Other fatigue: Secondary | ICD-10-CM | POA: Diagnosis not present

## 2021-08-21 DIAGNOSIS — E669 Obesity, unspecified: Secondary | ICD-10-CM

## 2021-08-21 DIAGNOSIS — Z6841 Body Mass Index (BMI) 40.0 and over, adult: Secondary | ICD-10-CM

## 2021-08-21 HISTORY — DX: Type 2 diabetes mellitus without complications: E11.9
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
## 2021-08-22 LAB — CBC WITH DIFFERENTIAL/PLATELET
Basophils Absolute: 0 10*3/uL (ref 0.0–0.2)
Basos: 0 %
EOS (ABSOLUTE): 0.1 10*3/uL (ref 0.0–0.4)
Eos: 1 %
Hematocrit: 43.8 % (ref 34.0–46.6)
Hemoglobin: 15 g/dL (ref 11.1–15.9)
Immature Grans (Abs): 0 10*3/uL (ref 0.0–0.1)
Immature Granulocytes: 0 %
Lymphocytes Absolute: 2.8 10*3/uL (ref 0.7–3.1)
Lymphs: 34 %
MCH: 29.9 pg (ref 26.6–33.0)
MCHC: 34.2 g/dL (ref 31.5–35.7)
MCV: 87 fL (ref 79–97)
Monocytes Absolute: 0.4 10*3/uL (ref 0.1–0.9)
Monocytes: 5 %
Neutrophils Absolute: 4.7 10*3/uL (ref 1.4–7.0)
Neutrophils: 60 %
Platelets: 291 10*3/uL (ref 150–450)
RBC: 5.01 x10E6/uL (ref 3.77–5.28)
RDW: 13.3 % (ref 11.7–15.4)
WBC: 8 10*3/uL (ref 3.4–10.8)

## 2021-08-22 LAB — COMPREHENSIVE METABOLIC PANEL
ALT: 68 IU/L — ABNORMAL HIGH (ref 0–32)
AST: 58 IU/L — ABNORMAL HIGH (ref 0–40)
Albumin/Globulin Ratio: 1.2 (ref 1.2–2.2)
Albumin: 4.1 g/dL (ref 3.9–5.0)
Alkaline Phosphatase: 159 IU/L — ABNORMAL HIGH (ref 44–121)
BUN/Creatinine Ratio: 17 (ref 9–23)
BUN: 13 mg/dL (ref 6–20)
Bilirubin Total: 0.5 mg/dL (ref 0.0–1.2)
CO2: 21 mmol/L (ref 20–29)
Calcium: 9.2 mg/dL (ref 8.7–10.2)
Chloride: 100 mmol/L (ref 96–106)
Creatinine, Ser: 0.78 mg/dL (ref 0.57–1.00)
Globulin, Total: 3.5 g/dL (ref 1.5–4.5)
Glucose: 135 mg/dL — ABNORMAL HIGH (ref 70–99)
Potassium: 4 mmol/L (ref 3.5–5.2)
Sodium: 137 mmol/L (ref 134–144)
Total Protein: 7.6 g/dL (ref 6.0–8.5)
eGFR: 107 mL/min/{1.73_m2} (ref >59)

## 2021-08-22 LAB — VITAMIN D 25 HYDROXY (VIT D DEFICIENCY, FRACTURES): Vit D, 25-Hydroxy: 30.8 ng/mL (ref 30.0–100.0)

## 2021-08-22 LAB — TSH: TSH: 2.17 u[IU]/mL (ref 0.450–4.500)

## 2021-08-22 LAB — LIPID PANEL WITH LDL/HDL RATIO
Cholesterol, Total: 160 mg/dL (ref 100–199)
HDL: 35 mg/dL — ABNORMAL LOW (ref >39)
LDL Chol Calc (NIH): 99 mg/dL (ref 0–99)
LDL/HDL Ratio: 2.8 ratio (ref 0.0–3.2)
Triglycerides: 145 mg/dL (ref 0–149)
VLDL Cholesterol Cal: 26 mg/dL (ref 5–40)

## 2021-08-22 LAB — HEMOGLOBIN A1C
Est. average glucose Bld gHb Est-mCnc: 148 mg/dL
Hgb A1c MFr Bld: 6.8 % — ABNORMAL HIGH (ref 4.8–5.6)

## 2021-08-22 LAB — FOLATE: Folate: 7.3 ng/mL (ref >3.0)

## 2021-08-22 LAB — T4, FREE: Free T4: 0.97 ng/dL (ref 0.82–1.77)

## 2021-08-22 LAB — INSULIN, RANDOM: INSULIN: 81.4 u[IU]/mL — ABNORMAL HIGH (ref 2.6–24.9)

## 2021-08-22 LAB — VITAMIN B12: Vitamin B-12: 630 pg/mL (ref 232–1245)

## 2021-08-22 LAB — T3: T3, Total: 208 ng/dL — ABNORMAL HIGH (ref 71–180)

## 2021-08-22 NOTE — Progress Notes (Signed)
Chief Complaint:   OBESITY Lori Clements (MR# [id number]) is a 26 y.o. female who presents for evaluation and treatment of obesity and related comorbidities. Current BMI is Body mass index is 54.07 kg/m. Lori Clements has been struggling with her weight for many years and has been unsuccessful in either losing weight, maintaining weight loss, or reaching her healthy weight goal.  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 She works full-time 50+ hours per week. Her Fiance is obese as well. Eats out 5+ days per week due to time/busy with work, but she and her fiance "loves to cook". She is here because she wants to be healthy and have a baby. She also has started with [facility] to explore weight loss surgery.  Lori Clements is currently in the action stage of change and ready to dedicate time achieving and maintaining a healthier weight. Lori Clements is interested in becoming our patient and working on intensive lifestyle modifications including (but not limited to) diet and exercise for weight loss.  Lori Clements's habits were reviewed today and are as follows: Her family eats meals together, she thinks her family will eat healthier with her, her desired weight loss is 175 lbs, she has been heavy most of her life, she started gaining weight in college, her heaviest weight ever was 320 pounds, she has significant food cravings issues, she snacks frequently in the evenings, she is frequently drinking liquids with calories, she frequently makes poor food choices, she has problems with excessive hunger, she frequently eats larger portions than normal, she has binge eating behaviors, and she struggles with emotional eating.  Depression Screen Lori Clements's Food and Mood (modified PHQ-9) score was 17.  Depression screen Lori Clements 2/9 08/21/2021  Decreased Interest 2  Down, Depressed, Hopeless 2  PHQ - 2 Score 4  Altered sleeping 3  Tired, decreased energy 3  Change in appetite 2  Feeling bad or failure  about yourself  3  Trouble concentrating 1  Moving slowly or fidgety/restless 1  Suicidal thoughts 0  PHQ-9 Score 17  Difficult doing work/chores Somewhat difficult    Subjective:   1. Other fatigue Lori Clements admits to daytime somnolence and admits to waking up still tired. Patient has a history of symptoms of daytime fatigue and morning fatigue. Lori Clements generally gets 8 or 10 hours of sleep per night, and states that she has nightime awakenings. Snoring is present. Apneic episodes are present. Epworth Sleepiness Score is 16.   2. Shortness of breath on exertion Lori Clements notes increasing shortness of breath with exercising and seems to be worsening over time with weight gain. She notes getting out of breath sooner with activity than she used to. This has not gotten worse recently. Lori Clements denies shortness of breath at rest or orthopnea.  3. Essential hypertension Lori Clements was diagnosed 11/22, and started on medications then. She is taking losartan 25 mg daily and her at home blood pressure is 130/70-80.  4. Obstructive sleep apnea on CPAP Lori Clements has great compliance with mask, and uses every night. Sleep doctor with [facility]. No issues or concerns.   5. Gastroesophageal reflux disease, unspecified whether esophagitis present Lori Clements has a history of IBS as well but patient expressed no concerns today with eating food on the meal plan thus symptoms are well controlled currently.. She is taking famotidine OTC  6. Other specified eating disorder Lori Clements has binge eating tendencies 1-2 times per week for >3 months. She has episodes of significantly over eating and hides  it. She feels shameful after. Also has a history of bulimia, 1 year ago was her last purge episode.  7. Mood disorder (HCC) with emotional eating GAD since college 11-04-14). Lori Clements's dad passed away while she was in college, and caused panic attacks. No counselor now, but used in the past. She denies depression. She is taking Zoloft and trazodone.    8. At risk for impaired metabolic function Lori Clements is at increased risk for impaired metabolic function due to current nutrition and muscle mass.    Assessment/Plan:   Orders Placed This Encounter  Procedures   Hemoglobin A1c   Folate   Comprehensive metabolic panel   CBC with Differential/Platelet   Vitamin B12   VITAMIN D 25 Hydroxy (Vit-D Deficiency, Fractures)   TSH   T4, free   T3   Lipid Panel With LDL/HDL Ratio   Insulin, random   EKG 12-Lead    Medications Discontinued During This Encounter  Medication Reason   etonogestrel-ethinyl estradiol (NUVARING) 0.12-0.015 MG/24HR vaginal ring    RANITIDINE HCL PO    sertraline (ZOLOFT) 50 MG tablet     1. Other fatigue Lori Clements does feel that her weight is causing her energy to be lower than it should be. Fatigue may be related to obesity, depression or many other causes. Labs will be ordered, and in the meanwhile, Lori Clements will focus on self care including making healthy food choices, increasing physical activity and focusing on stress reduction. Asked patient to bring [place] a copy, as I explained I cannot see them in EMR.  - Hemoglobin A1c - Folate - EKG 12-Lead - Vitamin B12 - VITAMIN D 25 Hydroxy (Vit-D Deficiency, Fractures) - TSH - T4, free - T3 - Insulin, random  2. Shortness of breath on exertion Lori Clements does feel that she gets out of breath more easily that she used to when she exercises. Lori Clements's shortness of breath appears to be obesity related and exercise induced. She has agreed to work on weight loss and gradually increase exercise to treat her exercise induced shortness of breath. Will continue to monitor closely.  3. Essential hypertension [name]'s blood pressure is essentially at goal today. She will continue her medications, decrease salt intake, and avoid prepared meals and get low salt lunch meat etc. She is to increase her water.  - Comprehensive metabolic panel - CBC with Differential/Platelet - Lipid Panel  With LDL/HDL Ratio  4. Obstructive sleep apnea on CPAP Intensive lifestyle modifications are the first line treatment for this issue. Lori Clements will continue her treatment per sleep doctor. Counseling done on the importance of nightly use of mask and good control of symptoms. She will continue to work on diet, exercise and weight loss efforts. We will continue to monitor. Orders and follow up as documented in patient record.   5. Gastroesophageal reflux disease, unspecified whether esophagitis present Intensive lifestyle modifications are the first line treatment for this issue. We discussed several lifestyle modifications today. We will check labs today. Lori Clements will continue her medications and prudent nutritional plan. She will work on weight loss to help alternate her symptoms. Orders and follow up as documented in patient record.   Counseling If a person has gastroesophageal reflux disease (GERD), food and stomach acid move back up into the esophagus and cause symptoms or problems such as damage to the esophagus. Anti-reflux measures include: raising the head of the bed, avoiding tight clothing or belts, avoiding eating late at night, not lying down shortly after mealtime, and achieving weight loss.  Avoid ASA, NSAID's, caffeine, alcohol, and tobacco.  OTC Pepcid and/or Tums are often very helpful for as needed use.  However, for persisting chronic or daily symptoms, stronger medications like Omeprazole may be needed. You may need to avoid foods and drinks such as: Coffee and tea (with or without caffeine). Drinks that contain alcohol. Energy drinks and sports drinks. Bubbly (carbonated) drinks or sodas. Chocolate and cocoa. Peppermint and mint flavorings. Garlic and onions. Horseradish. Spicy and acidic foods. These include peppers, chili powder, curry powder, vinegar, hot sauces, and BBQ sauce. Citrus fruit juices and citrus fruits, such as oranges, lemons, and limes. Tomato-based foods.  These include red sauce, chili, salsa, and pizza with red sauce. Fried and fatty foods. These include donuts, french fries, potato chips, and high-fat dressings. High-fat meats. These include hot dogs, rib eye steak, sausage, ham, and bacon.  6. Other specified eating disorder We will refer to Dr. Dewaine Conger, our Bariatric Psychologist for intensive counseling. Has a h/o bulimia nervosa but recently without purging the past 1 year. According to pt's history today- she does not meet the criteria for BED at this time, however, we will follow up closely. We may consider medications in the future as needed as we follow the patient's symptoms and concerns. She will continue Zoloft and trazodone for now per PCP.  7. Mood disorder (HCC) with emotional eating Continue her mood medications per PCP. GAD and depression symptoms are very well controlled currently per the patient. She has no concerns. Orders and follow up as documented in patient record.   8. At risk for impaired metabolic function Lori Clements was given approximately 23 minutes of impaired  metabolic function prevention counseling today. We discussed intensive lifestyle modifications today with an emphasis on specific nutrition and exercise instructions and strategies.   Repetitive spaced learning was employed today to elicit superior memory formation and behavioral change.  9. Obesity with current BMI of 54.2 Lori Clements is currently in the action stage of change and her goal is to continue with weight loss efforts. I recommend Lori Clements begin the structured treatment plan as follows:  She has agreed to the Category 4 Plan.  Follow up appointment with Dr. Dewaine Conger in the near future.  She is aware she may need even more food with a REE of 3355. Needs 228 gr protein per day to be at 1.5 grams per kilogram.  Exercise goals: As is.   Behavioral modification strategies: increasing lean protein intake, decreasing simple carbohydrates, and planning for  success.  She was informed of the importance of frequent follow-up visits to maximize her success with intensive lifestyle modifications for her multiple health conditions. She was informed we would discuss her lab results at her next visit unless there is a critical issue that needs to be addressed sooner. Lori Clements agreed to keep her next visit at the agreed upon time to discuss these results.    Objective:   Blood pressure 133/84, pulse 78, temperature 97.9 F (36.6 C), height 5\' 6"  (1.676 m), weight (!) 335 lb (152 kg), SpO2 98 %. Body mass index is 54.07 kg/m.  EKG: Normal sinus rhythm, rate 82 BPM.  Indirect Calorimeter completed today shows a VO2 of 485 and a REE of 3355.  Her calculated basal metabolic rate is thus her basal metabolic rate is better than expected.  General: Cooperative, alert, well developed, in no acute distress. HEENT: Conjunctivae and lids unremarkable. Cardiovascular: Regular rhythm.  Lungs: Normal work of breathing. Neurologic: No focal deficits.  Lab Results  Component Value Date   CREATININE 0.78 08/21/2021   BUN 13 08/21/2021   NA 137 08/21/2021   K 4.0 08/21/2021   CL 100 08/21/2021   CO2 21 08/21/2021   Lab Results  Component Value Date   ALT 68 (H) 08/21/2021   AST 58 (H) 08/21/2021   ALKPHOS 159 (H) 08/21/2021   BILITOT 0.5 08/21/2021   Lab Results  Component Value Date   HGBA1C 6.8 (H) 08/21/2021   Lab Results  Component Value Date   INSULIN 81.4 (H) 08/21/2021   Lab Results  Component Value Date   TSH 2.170 08/21/2021   Lab Results  Component Value Date   CHOL 160 08/21/2021   HDL 35 (L) 08/21/2021   LDLCALC 99 08/21/2021   TRIG 145 08/21/2021   Lab Results  Component Value Date   WBC 8.0 08/21/2021   HGB 15.0 08/21/2021   HCT 43.8 08/21/2021   MCV 87 08/21/2021   PLT 291 08/21/2021   No results found for: IRON, TIBC, FERRITIN  Attestation Statements:   Reviewed by clinician on day of visit: allergies,  medications, problem list, medical history, surgical history, family history, social history, and previous encounter notes.   Trude McburneyI, Sharon Martin, am acting as transcriptionist for Marsh & McLennanDeborah Sonu Kruckenberg, DO.  I have reviewed the above documentation for accuracy and completeness, and I agree with the above. Carlye Grippe- Kyren Knick J Obed Samek, D.O.  The 21st Century Cures Act was signed into law in 2016 which includes the topic of electronic health records.  This provides immediate access to information in MyChart.  This includes consultation notes, operative notes, office notes, lab results and pathology reports.  If you have any questions about what you read please let us know at your next visit so we can discuss your concerns and take corrective action if need be.  We are right here with you.

## 2021-09-03 ENCOUNTER — Telehealth (INDEPENDENT_AMBULATORY_CARE_PROVIDER_SITE_OTHER): Payer: Managed Care, Other (non HMO) | Admitting: Psychology

## 2021-09-03 DIAGNOSIS — F411 Generalized anxiety disorder: Secondary | ICD-10-CM

## 2021-09-03 DIAGNOSIS — F5089 Other specified eating disorder: Secondary | ICD-10-CM | POA: Diagnosis not present

## 2021-09-04 ENCOUNTER — Encounter (INDEPENDENT_AMBULATORY_CARE_PROVIDER_SITE_OTHER): Payer: Self-pay

## 2021-09-04 ENCOUNTER — Ambulatory Visit (INDEPENDENT_AMBULATORY_CARE_PROVIDER_SITE_OTHER): Payer: BLUE CROSS/BLUE SHIELD | Admitting: Family Medicine

## 2021-09-11 ENCOUNTER — Other Ambulatory Visit (HOSPITAL_COMMUNITY): Payer: Self-pay | Admitting: Surgery

## 2021-09-11 ENCOUNTER — Other Ambulatory Visit: Payer: Self-pay | Admitting: Surgery

## 2021-09-24 ENCOUNTER — Encounter (INDEPENDENT_AMBULATORY_CARE_PROVIDER_SITE_OTHER): Payer: Self-pay | Admitting: Family Medicine

## 2021-09-24 ENCOUNTER — Ambulatory Visit (INDEPENDENT_AMBULATORY_CARE_PROVIDER_SITE_OTHER): Payer: Managed Care, Other (non HMO) | Admitting: Family Medicine

## 2021-09-24 VITALS — BP 162/90 | HR 71 | Temp 98.1°F | Ht 66.0 in | Wt 334.0 lb

## 2021-09-24 DIAGNOSIS — E1169 Type 2 diabetes mellitus with other specified complication: Secondary | ICD-10-CM | POA: Diagnosis not present

## 2021-09-24 DIAGNOSIS — E559 Vitamin D deficiency, unspecified: Secondary | ICD-10-CM

## 2021-09-24 DIAGNOSIS — I152 Hypertension secondary to endocrine disorders: Secondary | ICD-10-CM

## 2021-09-24 DIAGNOSIS — K760 Fatty (change of) liver, not elsewhere classified: Secondary | ICD-10-CM

## 2021-09-24 DIAGNOSIS — E1159 Type 2 diabetes mellitus with other circulatory complications: Secondary | ICD-10-CM | POA: Diagnosis not present

## 2021-09-24 DIAGNOSIS — K219 Gastro-esophageal reflux disease without esophagitis: Secondary | ICD-10-CM

## 2021-09-24 DIAGNOSIS — F39 Unspecified mood [affective] disorder: Secondary | ICD-10-CM

## 2021-09-24 DIAGNOSIS — Z6841 Body Mass Index (BMI) 40.0 and over, adult: Secondary | ICD-10-CM

## 2021-09-24 DIAGNOSIS — E669 Obesity, unspecified: Secondary | ICD-10-CM

## 2021-09-24 DIAGNOSIS — Z9189 Other specified personal risk factors, not elsewhere classified: Secondary | ICD-10-CM

## 2021-09-24 MED ORDER — VITAMIN D (ERGOCALCIFEROL) 1.25 MG (50000 UNIT) PO CAPS: 50000.0000 [IU] | ORAL_CAPSULE | ORAL | 0 refills | Status: DC

## 2021-09-25 MED ORDER — LOSARTAN POTASSIUM 25 MG PO TABS: 50.0000 mg | ORAL_TABLET | Freq: Every day | ORAL | 0 refills | Status: DC

## 2021-09-27 ENCOUNTER — Other Ambulatory Visit: Payer: Self-pay | Admitting: Surgery

## 2021-09-27 ENCOUNTER — Ambulatory Visit (HOSPITAL_COMMUNITY)
Admission: RE | Admit: 2021-09-27 | Discharge: 2021-09-27 | Disposition: A | Discharge status: Home / Self Care | Payer: Managed Care, Other (non HMO) | Source: Ambulatory Visit | Attending: Surgery | Admitting: Surgery

## 2021-09-27 NOTE — Progress Notes (Signed)
? ? ? ?Chief Complaint:  ? ?OBESITY ?Karlee is here to discuss her progress with her obesity treatment plan along with follow-up of her obesity related diagnoses. Cheyanne is on the Category 4 Plan and states she is following her eating plan approximately 60% of the time. Korri states she is walking for 30 minutes 3 times per week. ? ?Today's visit was #: 2 ?Starting weight: 335 lbs ?Starting date: 08/21/2021 ?Today's weight: 334 lbs ?Today's date: 09/24/2021 ?Total lbs lost to date: 1 ?Total lbs lost since last in-office visit: 1 ? ?Interim History: Tomia Brookelynne Dimperio is here today for her first follow-up office visit since starting the program with [place]. All blood work/ lab tests that were recently ordered by myself or an outside provider were reviewed with patient today per their request. Extended time was spent counseling her on all new disease processes that were discovered or preexisting ones that are affected by BMI. She understands that many of these abnormalities will need to monitored regularly along with the current treatment plan of prudent dietary changes, in which we are making each and every office visit, to improve these health parameters. Aldine did great for 3 weeks or so and then weighed herself. She didn't lose weight and she was upset and it threw her off track. She did not have hunger or physical cravings when on the plan/eating all of the food on the plan.  ? ?Subjective:  ? ?1. Hypertension associated with type 2 diabetes mellitus (HCC) ?Kameran's blood pressure has been up lately 2-3 weeks and feels "hot in her head", and can tell its high. 140/90's at home but never over 150/100. ? ?2. Type 2 diabetes mellitus with other specified complication, without long-term current use of insulin (HCC) ?New diagnosis. Raechelle's last A1c in October 2022 was completely within normal limits. I discussed labs with the patient today.  ? ?3. Vitamin D deficiency disease ?New diagnosis. Kimberely notes fatigue/tired. I  discussed labs with the patient today.  ? ?4. NAFLD (nonalcoholic fatty liver disease) ?New diagnosis. Leea has a history of this diagnosis. CT abdominal/pelvis in 2015 was normal. No ETOH or excessive tylenol use. Visceral fat rating is 18. I discussed labs with the patient today. ? ?5. Gastroesophageal reflux disease, unspecified whether esophagitis present ?Reanne's symptoms have improved a lot with the meal plan. She didn't have any reflux at all when on the plan. I discussed labs with the patient today. ? ?6. Mood disorder (HCC) with emotional eating ?Amparo has a history of generalized anxiety disorder. She saw Dr. Dewaine Conger and was told she would benefit more from general CBT. She knows of a Veterinary surgeon at [facility].  ? ?7. At risk for deficient knowledge of diabetes mellitus ?Lynnleigh is at risk for deficient knowledge of diabetes mellitus due to new onset of diabetes mellitus today. ? ?Assessment/Plan:  ?No orders of the defined types were placed in this encounter. ? ? ?Medications Discontinued During This Encounter  ?Medication Reason  ? losartan (COZAAR) 25 MG tablet Reorder  ?  ? ?Meds ordered this encounter  ?Medications  ? Vitamin D, Ergocalciferol, (DRISDOL) 1.25 MG (50000 UNIT) CAPS capsule  ?  Sig: Take 1 capsule (50,000 Units total) by mouth every 7 (seven) days.  ?  Dispense:  4 capsule  ?  Refill:  0  ? losartan (COZAAR) 25 MG tablet  ?  Sig: Take 2 tablets (50 mg total) by mouth daily.  ?  Dispense:  60 tablet  ?  Refill:  0  ?  ? ?1. Hypertension associated with type 2 diabetes mellitus (HCC) ?Saleah will continue her prudent nutritional plan, decrease salt and weight loss. She agreed to increase losartan from 25 mg to 50 mg. She will take 2 of her 25 mg tablets daily. She is to check her blood pressure at home and close follow ups here. ? ?- losartan (COZAAR) 25 MG tablet; Take 2 tablets (50 mg total) by mouth daily.  Dispense: 60 tablet; Refill: 0 ? ?2. Type 2 diabetes mellitus with other  specified complication, without long-term current use of insulin (HCC) ?Follow up with her PCP next month or sooner if desired. Glorian will continue her prudent nutritional plan, decrease simple carbohydrate, increase proteins, and if needed will consider GLP-1 in the near future. ? ?3. Vitamin D deficiency disease ?Ventura agreed to start prescription Vitamin D 50,000 IU weekly with no refills.  ? ?- I discussed the importance of vitamin D to the patient's health and well-being.  ?- I reviewed possible symptoms of low Vitamin D:  low energy, depressed mood, muscle aches, joint aches, osteoporosis etc. was reviewed with patient ?- low Vitamin D levels may be linked to an increased risk of cardiovascular events and even increased risk of cancers- such as colon and breast.  ?- ideal vitamin D levels reviewed with patient  ?- I recommend pt take a weekly prescription vit D - see script below   ?- Informed patient this may be a lifelong thing, and she was encouraged to continue to take the medicine until told otherwise.    ?- weight loss will likely improve availability of vitamin D, thus encouraged Bevelyn to continue with meal plan and their weight loss efforts to further improve this condition.  Thus, we will need to monitor levels regularly (every 3-4 mo on average) to keep levels within normal limits and prevent over supplementation. ?- pt's questions and concerns regarding this condition addressed. ? ?- Vitamin D, Ergocalciferol, (DRISDOL) 1.25 MG (50000 UNIT) CAPS capsule; Take 1 capsule (50,000 Units total) by mouth every 7 (seven) days.  Dispense: 4 capsule; Refill: 0 ? ?4. NAFLD (nonalcoholic fatty liver disease) ?We discussed the likely diagnosis of non-alcoholic fatty liver disease today and how this condition is obesity related. Shadell was educated the importance of weight loss. Brittnee agreed to continue with her weight loss efforts with healthier diet and exercise as an essential part of her treatment plan. ? ?5.  Gastroesophageal reflux disease, unspecified whether esophagitis present ?Intensive lifestyle modifications are the first line treatment for this issue. We discussed several lifestyle modifications today. Audreyana will continue her prudent nutritional plan, continue her medications per her PCP, and avoid trigger foods. Orders and follow up as documented in patient record.  ? ?Counseling ?If a person has gastroesophageal reflux disease (GERD), food and stomach acid move back up into the esophagus and cause symptoms or problems such as damage to the esophagus. ?Anti-reflux measures include: raising the head of the bed, avoiding tight clothing or belts, avoiding eating late at night, not lying down shortly after mealtime, and achieving weight loss. ?Avoid ASA, NSAID's, caffeine, alcohol, and tobacco.  ?OTC Pepcid and/or Tums are often very helpful for as needed use.  ?However, for persisting chronic or daily symptoms, stronger medications like Omeprazole may be needed. ?You may need to avoid foods and drinks such as: ?Coffee and tea (with or without caffeine). ?Drinks that contain alcohol. ?Energy drinks and sports drinks. ?Bubbly (carbonated) drinks or sodas. ?Chocolate and cocoa. ?  Peppermint and mint flavorings. ?Garlic and onions. ?Horseradish. ?Spicy and acidic foods. These include peppers, chili powder, curry powder, vinegar, hot sauces, and BBQ sauce. ?Citrus fruit juices and citrus fruits, such as oranges, lemons, and limes. ?Tomato-based foods. These include red sauce, chili, salsa, and pizza with red sauce. ?Fried and fatty foods. These include donuts, french fries, potato chips, and high-fat dressings. ?High-fat meats. These include hot dogs, rib eye steak, sausage, ham, and bacon. ? ?6. Mood disorder (HCC) with emotional eating ?Kenlea will call for CBT appointment with her old counselor to help with GAD and PTSD. She will follow up with Dr. Dewaine Conger in the future as per her recommendations for emotional eating  only. Keller will continue Zoloft until her appointment with her PCP in May to discuss medications. ? ?7. At risk for deficient knowledge of diabetes mellitus ?Nusaiba was given approximately 30 minutes of diabe

## 2021-10-15 ENCOUNTER — Ambulatory Visit (INDEPENDENT_AMBULATORY_CARE_PROVIDER_SITE_OTHER): Payer: Managed Care, Other (non HMO) | Admitting: Family Medicine

## 2021-11-05 ENCOUNTER — Other Ambulatory Visit (INDEPENDENT_AMBULATORY_CARE_PROVIDER_SITE_OTHER): Payer: Self-pay | Admitting: Family Medicine

## 2021-11-05 DIAGNOSIS — I152 Hypertension secondary to endocrine disorders: Secondary | ICD-10-CM

## 2021-11-06 ENCOUNTER — Encounter: Payer: Managed Care, Other (non HMO) | Attending: Surgery | Admitting: Skilled Nursing Facility1

## 2021-11-06 ENCOUNTER — Encounter: Payer: Self-pay | Admitting: Skilled Nursing Facility1

## 2021-11-06 DIAGNOSIS — E669 Obesity, unspecified: Secondary | ICD-10-CM | POA: Diagnosis present

## 2021-11-06 DIAGNOSIS — Z6841 Body Mass Index (BMI) 40.0 and over, adult: Secondary | ICD-10-CM | POA: Diagnosis not present

## 2021-11-06 NOTE — Progress Notes (Signed)
Nutrition Assessment for Bariatric Surgery ?Medical Nutrition Therapy ?Appt Start Time: 11:15    End Time: 12:22 ? ?Patient was seen on 11/06/2021 for Pre-Operative Nutrition Assessment. Letter of approval faxed to [facility] bariatric surgery program coordinator on 11/06/2021.  ? ?Referral stated Supervised Weight Loss (SWL) visits needed: 0 ? ? ?Not cleared at this time:  Pt to follow up for minimum of one more visit to assist pt with progressing through stages of change/further nutrition education. RD advised pt that this follow up visit is not mandated through insurance. Pt verbalized agreement. ? ? ?Pt does not have DM as a problem listed in referral so unable to charge DM for this visit.  ? ?Planned surgery: Sleeve gastrectomy ?Pt expectation of surgery: To be Healthy ?Pt expectation of dietitian: education ?  ?NUTRITION ASSESSMENT ?  ?Anthropometrics  ?Start weight at [facility]: 340.8 lbs (date: 11/06/2021)  ?Height: 66 in ?BMI: 55.01 kg/m2   ?  ?Clinical  ?Medical hx: IBS, HTN, Obesity, Sleep Apnea, T2DM ?Medications: Trazodone, Famotindine, Losartan Poassium, Sertraline  ?Labs: 08/21/2021:  A1C 6.8; HDL 35; Vit D 30.8; glucose 135;  ?Notable signs/symptoms: ?Any previous deficiencies? No ? ?Micronutrient Nutrition Focused Physical Exam: ?Hair: No issues observed ?Eyes: No issues observed ?Mouth: No issues observed ?Neck: No issues observed ?Nails: No issues observed ?Skin: No issues observed ? ?Lifestyle & Dietary Hx ? ?Pt admits to binge eating up to 3 times a week and is down to 1 time a week, and has felt shame and/or guilt after eating too much food or after what she calls "sneaking" food (eating in private food she feels she should not be eating). ?Pt states she lives with her fianc? who is emotionally supportive. The pt and fianc? performs the food shopping and the pt and fianc? prepare the meals together. ?Pt states she does not skip meals. ?Pt states she eats out for lunch while at  work. ? ?Pt states she is actively working on not eating in secret and when she does having a conversation with her fiance for more support and to release some of the feelings of shame and guilt.  ? ?Pt states she does plan on working with a mental health professional due to her newly discovered disordered eating patterns.  ? ?24-Hr Dietary Recall ?First Meal: greek yogurt, belvita, banana w/ peanut butter ?Snack: 2 pieces of candy ?Second Meal: chef salad with honey mustard dressing ?Snack: Chezit 140 calorie pack, candy from the door of employee ?Third Meal: Chili's, honey chipotle chicken tenders and one slider, and tortilla chips ?Snack: ice cream ?Beverages: diet soda, water with flavorings  ?  ?Estimated Energy Needs ?Calories: 1600 ? ? ?NUTRITION DIAGNOSIS  ?Overweight/obesity ([place]-3.3) related to past poor dietary habits and physical inactivity as evidenced by patient w/ planned sleeve gastrectomy surgery following dietary guidelines for continued weight loss. ?  ? ?NUTRITION INTERVENTION  ?Nutrition counseling (C-1) and education (E-2) to facilitate bariatric surgery goals. ? ?Discussed behavioral changes needed in order to adhere to the diet phases post surgery in order to achieve long term success. ?Discussed the importance of understanding her relationship with food, and events that lead up to loss of control eating/disordered eating patterns ? ?Discussed basic physiology of Diabetes ?Discussed target Blood Glucose ranges pre and post meals and A1C ?Discussed carbohydrate counting by food group as a method of portion control ?Reading food labels ?Benefits of increased activity ? ?Educated pt on micronutrient deficiencies post surgery and strategies to mitigate that risk ?  ?  Pre-Op Goals Reviewed with the Patient ?Track food and beverage intake (pen and paper, MyFitness Pal, Baritastic app, etc.) ?Make healthy food choices while monitoring portion sizes ?Consume 3 meals per day or try to eat every 3-5  hours ?Avoid concentrated sugars and fried foods ?Keep sugar & fat in the single digits per serving on food labels ?Practice CHEWING your food (aim for applesauce consistency) ?Practice not drinking 15 minutes before, during, and 30 minutes after each meal and snack ?Avoid all carbonated beverages (ex: soda, sparkling beverages)  ?Limit caffeinated beverages (ex: coffee, tea, energy drinks) ?Avoid all sugar-sweetened beverages (ex: regular soda, sports drinks)  ?Avoid alcohol  ?Aim for 64-100 ounces of FLUID daily (with at least half of fluid intake being plain water)  ?Aim for at least 60-80 grams of PROTEIN daily ?Look for a liquid protein source that contains ?15 g protein and ?5 g carbohydrate (ex: shakes, drinks, shots) ?Make a list of non-food related activities ?Physical activity is an important part of a healthy lifestyle so keep it moving! The goal is to reach 150 minutes of exercise per week, including cardiovascular and weight baring activity. ?Think of your walks for [place] (labmix) health to assist in motivating you to be more active ?Ask your doctor for a prescription for glucometer  ? ?*Goals that are bolded indicate the pt would like to start working towards these ? ?Handouts Provided Include  ?Bariatric Surgery handouts (Nutrition Visits, Pre-Op Goals, Protein Shakes, Vitamins & Minerals) ?Guide for Your Journey with Diabetes ? ?Learning Style & Readiness for Change ?Teaching method utilized: Visual & Auditory  ?Demonstrated degree of understanding via: Teach Back  ?Readiness Level: Ready ?Barriers to learning/adherence to lifestyle change: disordered eating patterns, inclusive of binging and closet eating. ? ?RD's Notes for Next Visit ?Review Bariatric Surgery handouts. ?  ?  ?MONITORING & EVALUATION ?Dietary intake, weekly physical activity, body weight, and pre-op goals reached at next nutrition visit.  ?  ?Next Steps  ?Patient is to follow up at [facility] in two weeks. ?Patient is to follow up at [facility]  for Pre-Op Class >2 weeks before surgery for further nutrition education.  ?

## 2021-11-27 ENCOUNTER — Encounter: Payer: Managed Care, Other (non HMO) | Attending: Surgery | Admitting: Skilled Nursing Facility1

## 2021-11-27 ENCOUNTER — Encounter: Payer: Self-pay | Admitting: Skilled Nursing Facility1

## 2021-11-27 DIAGNOSIS — E669 Obesity, unspecified: Secondary | ICD-10-CM | POA: Diagnosis present

## 2021-11-27 NOTE — Progress Notes (Signed)
Supervised Weight Loss Visit Bariatric Nutrition Education  Sleeve gastrectomy   Pt completed visits.   Pt has cleared nutrition requirements.    NUTRITION ASSESSMENT  Anthropometrics  Start weight at [facility]: 340.8 lbs (date: 11/06/2021)  Weight: 339.6 pounds BMI: 54.81 kg/m2     Clinical  Medical hx: IBS, HTN, Obesity, Sleep Apnea, T2DM Medications: Trazodone, Famotindine, Losartan Poassium, Sertraline  Labs: 08/21/2021:  A1C 6.8; HDL 35; Vit D 30.8; glucose 135;  Notable signs/symptoms: Any previous deficiencies? No  Lifestyle & Dietary Hx  Pt states she has not binged once and since checking blood sugars feels empowered. Pt states having those blood sugars numbers gives her instant gratification. Pt states she has not even been secret eating! Pt states her fiancee has been a really great support.  Pt state she has been actively working on stopping when she is full and feels really proud of herself.   Pt state she has been checking her blood sugars daily: fasting 118 and after breakfast 160 or 133  Pt states she did start metformin.   Estimated daily fluid intake:  oz Supplements:  Current average weekly physical activity: 4 days a week walking [place] and the pool  24-Hr Dietary Recall First Meal: banana and peanut butter and banana nut muffin Snack:  Second Meal: eaten out with healthy choice  Snack:  Third Meal: london broil  + mashed potatoes Snack: 3 oreos Beverages: diet soda or water  Estimated Energy Needs Calories: 1500   NUTRITION DIAGNOSIS  Overweight/obesity ([place]-3.3) related to past poor dietary habits and physical inactivity as evidenced by patient w/ planned sleeve gastrectomy surgery following dietary guidelines for continued weight loss.   NUTRITION INTERVENTION  Nutrition counseling (C-1) and education (E-2) to facilitate bariatric surgery goals.  Pre-Op Goals Progress & New Goals Continue: Think of your walks for [place] (labmix) health to assist  in motivating you to be more active Continue: Ask your doctor for a prescription for glucometer    Learning Style & Readiness for Change Teaching method utilized: Visual & Auditory  Demonstrated degree of understanding via: Teach Back  Readiness Level: action Barriers to learning/adherence to lifestyle change:    MONITORING & EVALUATION Dietary intake, weekly physical activity, body weight, and pre-op goals    Next Steps  Patient is to return to [facility]  Pt has completed visits. No further supervised visits required/recomended

## 2021-12-17 ENCOUNTER — Other Ambulatory Visit: Payer: Self-pay | Admitting: Nurse Practitioner

## 2021-12-17 ENCOUNTER — Other Ambulatory Visit (HOSPITAL_COMMUNITY)
Admission: RE | Admit: 2021-12-17 | Discharge: 2021-12-17 | Disposition: A | Discharge status: Home / Self Care | Payer: Managed Care, Other (non HMO) | Source: Ambulatory Visit | Attending: Nurse Practitioner | Admitting: Nurse Practitioner

## 2021-12-17 DIAGNOSIS — Z124 Encounter for screening for malignant neoplasm of cervix: Secondary | ICD-10-CM | POA: Diagnosis present

## 2021-12-20 ENCOUNTER — Telehealth: Payer: Self-pay | Admitting: Licensed Clinical Social Worker

## 2021-12-20 LAB — CYTOLOGY - PAP
Comment: NEGATIVE
Comment: NEGATIVE
Comment: NEGATIVE
HPV 16: NEGATIVE
HPV 18 / 45: NEGATIVE
High risk HPV: POSITIVE — AB

## 2021-12-20 NOTE — Telephone Encounter (Signed)
Scheduled appt per 6/28 referral. Pt is aware of appt date and time. Pt is aware to arrive 15 mins prior to appt time and to bring and updated insurance card. Pt is aware of appt location.   

## 2022-01-04 DIAGNOSIS — Z3202 Encounter for pregnancy test, result negative: Secondary | ICD-10-CM | POA: Diagnosis not present

## 2022-01-04 DIAGNOSIS — R87619 Unspecified abnormal cytological findings in specimens from cervix uteri: Secondary | ICD-10-CM | POA: Diagnosis not present

## 2022-01-30 ENCOUNTER — Encounter (INDEPENDENT_AMBULATORY_CARE_PROVIDER_SITE_OTHER): Payer: Self-pay

## 2022-02-04 ENCOUNTER — Other Ambulatory Visit: Payer: Self-pay

## 2022-02-04 ENCOUNTER — Inpatient Hospital Stay: Payer: BC Managed Care – PPO | Attending: Genetic Counselor | Admitting: Licensed Clinical Social Worker

## 2022-02-04 ENCOUNTER — Encounter: Payer: Self-pay | Admitting: Licensed Clinical Social Worker

## 2022-02-04 ENCOUNTER — Inpatient Hospital Stay: Payer: BC Managed Care – PPO

## 2022-02-04 DIAGNOSIS — Z8041 Family history of malignant neoplasm of ovary: Secondary | ICD-10-CM | POA: Diagnosis not present

## 2022-02-04 DIAGNOSIS — Z803 Family history of malignant neoplasm of breast: Secondary | ICD-10-CM

## 2022-02-04 NOTE — Progress Notes (Signed)
REFERRING PROVIDER: Ma Hillock, [address],  [place] [address]  PRIMARY PROVIDER:  Almedia Balls, NP  PRIMARY REASON FOR VISIT:  1. Family history of ovarian cancer   2. Family history of breast cancer      HISTORY OF PRESENT ILLNESS:   [name], a 26 y.o. female, was seen for a [facility] cancer genetics consultation at the request of Dr. Baldo Ash due to a family history of ovarian cancer.  [name] presents to clinic today to discuss the possibility of a hereditary predisposition to cancer, genetic testing, and to further clarify her future cancer risks, as well as potential cancer risks for family members.    CANCER HISTORY:  [name] is a 26 y.o. female with no personal history of cancer.    RISK FACTORS:  Menarche was at age 71.  OCP use for approximately 10 years.  Ovaries intact: yes.  Hysterectomy: no.  Menopausal status: premenopausal.  HRT use: 0 years. Colonoscopy: yes; normal. Mammogram within the last year: no. Number of breast biopsies: 0. Up to date with pelvic exams: yes.  Past Medical History:  Diagnosis Date   Anxiety    Back pain    Depression    Essential hypertension    GERD (gastroesophageal reflux disease)    IBS (irritable bowel syndrome)    Other fatigue    Shortness of breath on exertion    Sleep apnea    Thyroid disease     Past Surgical History:  Procedure Laterality Date   WISDOM TOOTH EXTRACTION N/A 2013    FAMILY HISTORY:  We obtained a detailed, 4-generation family history.  Significant diagnoses are listed below: Family History  Problem Relation Age of Onset   Obesity Mother    Drug abuse Mother    Schizophrenia Mother    Bipolar disorder Mother    Anxiety disorder Mother    Depression Mother    Cancer Mother        ovarian   Drug abuse Father    Schizophrenia Father    Bipolar disorder Father    Depression Other    Anxiety disorder Other    Bipolar  disorder Other    Schizophrenia Other    Drug abuse Other    Obesity Other    Breast cancer Other    Ovarian cancer Half-Sister        dx 24s   [name] was adopted and has limited information about her biological family members. She has 1 twin sister, 2 full sisters, 3 full brothers, 1 maternal half brother, 1 maternal half sister. Her half sister had ovarian cancer in her 9s and is living at 16.  [name]'s biological mother is living in her late 13s and had ovarian cancer, unknown age of diagnosis. Maternal grandmother's sister had breast cancer. Maternal grandmother's father had lung cancer. No other known cancers on this side of the family.  [name] is unaware of any cancers on her biological father's side of the family.   [name] is unaware of previous family history of genetic testing for hereditary cancer risks. There is no reported Ashkenazi Jewish ancestry. There is no known consanguinity.   GENETIC COUNSELING ASSESSMENT: [name] is a 26 y.o. female with a family history of ovarian cancer which is somewhat suggestive of a hereditary cancer syndrome and predisposition to cancer. We, therefore, discussed and recommended the following at today's visit.  DISCUSSION: We discussed that approximately 10-20% of ovarian cancer is hereditary. Most cases of hereditary ovarian cancer are associated with BRCA1/BRCA2 genes, although there are other genes associated with hereditary cancer as well. Cancers and risks are gene specific. We discussed that testing is beneficial for several reasons including knowing about cancer risks, identifying potential screening and risk-reduction options that may be appropriate, and to understand if other family members could be at risk for cancer and allow them to undergo genetic testing.   We reviewed the characteristics, features and inheritance patterns of hereditary cancer syndromes. We also discussed genetic testing,  including the appropriate family members to test, the process of testing, insurance coverage and turn-around-time for results. We discussed the implications of a negative, positive and/or variant of uncertain significant result. We recommended [name] pursue genetic testing for the [facility] CancerNext-Expanded+RNA gene panel.   Based on [name]'s family history of cancer, she meets medical criteria for genetic testing. Despite that she meets criteria, she may still have an out of pocket cost. We discussed that if her out of pocket cost for testing is over $100, the laboratory will call and confirm whether she wants to proceed with testing.  If the out of pocket cost of testing is less than $100 she will be billed by the genetic testing laboratory.   PLAN: After considering the risks, benefits, and limitations, [name] provided informed consent to pursue genetic testing and the blood sample was sent to [facility] for analysis of the CancerNext-Expanded+RNA panel. Results should be available within approximately 2-3 weeks' time, at which point they will be disclosed by telephone to [name], as will any additional recommendations warranted by these results. [name] will receive a summary of her genetic counseling visit and a copy of her results once available. This information will also be available in Epic.   [name]'s questions were answered to her satisfaction today. Our contact information was provided should additional questions or concerns arise. Thank you for the referral and allowing [place] to share in the care of your patient.   Faith Rogue, MS, [facility] Genetic Counselor [email or web address] Phone: [phone]  The patient was seen for a total of 25 minutes in face-to-face genetic counseling.  Dr. Grayland Ormond was available for discussion regarding this case.    _______________________________________________________________________ For Office Staff:  Number of people involved in session: 1 Was an Intern/ student involved with case: no'

## 2022-02-14 ENCOUNTER — Ambulatory Visit
Admission: EM | Admit: 2022-02-14 | Discharge: 2022-02-14 | Disposition: A | Discharge status: Home / Self Care | Payer: BC Managed Care – PPO | Attending: Family Medicine | Admitting: Family Medicine

## 2022-02-14 DIAGNOSIS — L03316 Cellulitis of umbilicus: Secondary | ICD-10-CM

## 2022-02-14 MED ORDER — MUPIROCIN 2 % EX OINT: 1.0000 | TOPICAL_OINTMENT | Freq: Two times a day (BID) | CUTANEOUS | 0 refills | Status: DC

## 2022-02-14 MED ORDER — AMOXICILLIN-POT CLAVULANATE 875-125 MG PO TABS: 1.0000 | ORAL_TABLET | Freq: Two times a day (BID) | ORAL | 0 refills | Status: AC

## 2022-02-14 NOTE — ED Provider Notes (Signed)
[facility]    CSN: [id number] Arrival date & time: 02/14/22  0903      History   Chief Complaint Chief Complaint  Patient presents with   bellybutton drainage     HPI Merryl Katniss Weedman is a 26 y.o. female.   HPI Here for bleeding from her bellybutton and pain there.  About a month ago she had similar symptoms but it resolved fairly quickly.  Nonetheless 2 days she has had pain around her bellybutton, and then she noted blood from it this morning and overnight.  No fever or chills  She did throw up once last week.  She has irregular periods due to having the Nexplanon.  Past Medical History:  Diagnosis Date   Anxiety    Back pain    Depression    Essential hypertension    GERD (gastroesophageal reflux disease)    IBS (irritable bowel syndrome)    Other fatigue    Shortness of breath on exertion    Sleep apnea    Thyroid disease     There are no problems to display for this patient.   Past Surgical History:  Procedure Laterality Date   WISDOM TOOTH EXTRACTION N/A 2013    OB History     Gravida  0   Para  0   Term  0   Preterm  0   AB  0   Living  0      SAB  0   IAB  0   Ectopic  0   Multiple  0   Live Births  0            Home Medications    Prior to Admission medications   Medication Sig Start Date End Date Taking? Authorizing Provider  amoxicillin-clavulanate (AUGMENTIN) 875-125 MG tablet Take 1 tablet by mouth 2 (two) times daily for 7 days. 02/14/22 02/21/22 Yes Aunna Snooks, Janace Aris, MD  mupirocin ointment (BACTROBAN) 2 % Apply 1 Application topically 2 (two) times daily. To affected area till better 02/14/22  Yes Kariah Loredo, Janace Aris, MD  Etonogestrel [facility]) Inject 1 each into the skin See admin instructions.    [provider]  famotidine (PEPCID) 20 MG tablet Take 20 mg by mouth daily. 07/18/21   [provider]  losartan (COZAAR) 25 MG tablet Take 2 tablets (50 mg total) by mouth daily.  09/25/21   Opalski, Gavin Pound, DO  sertraline (ZOLOFT) 100 MG tablet Take 100 mg by mouth daily. 08/18/21   [provider]  traZODone (DESYREL) 50 MG tablet Take 50 mg by mouth at bedtime as needed. 07/02/21   [provider]  Vitamin D, Ergocalciferol, (DRISDOL) 1.25 MG (50000 UNIT) CAPS capsule Take 1 capsule (50,000 Units total) by mouth every 7 (seven) days. 09/24/21   Thomasene Lot, DO    Family History Family History  Problem Relation Age of Onset   Obesity Mother    Drug abuse Mother    Schizophrenia Mother    Bipolar disorder Mother    Anxiety disorder Mother    Depression Mother    Cancer Mother        ovarian   Drug abuse Father    Schizophrenia Father    Bipolar disorder Father    Depression Other    Anxiety disorder Other    Bipolar disorder Other    Schizophrenia Other    Drug abuse Other    Obesity Other    Breast cancer Other  Ovarian cancer Half-Sister        dx 67s    Social History Social History   Tobacco Use   Smoking status: Never   Smokeless tobacco: Never  Vaping Use   Vaping Use: Never used  Substance Use Topics   Alcohol use: No   Drug use: No     Allergies   Naproxen   Review of Systems Review of Systems   Physical Exam Triage Vital Signs ED Triage Vitals  Enc Vitals Group     BP 02/14/22 0959 (!) 161/93     Pulse Rate 02/14/22 0959 68     Resp 02/14/22 0959 20     Temp 02/14/22 0959 98 F (36.7 C)     Temp src --      SpO2 02/14/22 0959 96 %     Weight --      Height --      Head Circumference --      Peak Flow --      Pain Score 02/14/22 0958 2     Pain Loc --      Pain Edu? --      Excl. in GC? --    No data found.  Updated Vital Signs BP (!) 161/93   Pulse 68   Temp 98 F (36.7 C)   Resp 20   SpO2 96%   Visual Acuity Right Eye Distance:   Left Eye Distance:   Bilateral Distance:    Right Eye Near:   Left Eye Near:    Bilateral Near:     Physical Exam Vitals reviewed.   Constitutional:      General: She is not in acute distress.    Appearance: She is not ill-appearing, toxic-appearing or diaphoretic.  HENT:     Mouth/Throat:     Mouth: Mucous membranes are moist.  Abdominal:     Palpations: Abdomen is soft.     Tenderness: There is no abdominal tenderness.  Skin:    Coloration: Skin is not pale.     Comments: In the right aspect of her umbilicus there is a pink indurated area that is tender.  There is bloody/purulent drainage dried on her umbilicus.  Neurological:     Mental Status: She is oriented to person, place, and time.  Psychiatric:        Behavior: Behavior normal.      UC Treatments / Results  Labs (all labs ordered are listed, but only abnormal results are displayed) Labs Reviewed - No data to display  EKG   Radiology No results found.  Procedures Procedures (including critical care time)  Medications Ordered in UC Medications - No data to display  Initial Impression / Assessment and Plan / UC Course  I have reviewed the triage vital signs and the nursing notes.  Pertinent labs & imaging results that were available during my care of the patient were reviewed by me and considered in my medical decision making (see chart for details).     This seems to be a cellulitis/abscess that is drained.  Antibiotics and mupirocin sent in. Final Clinical Impressions(s) / UC Diagnoses   Final diagnoses:  Cellulitis of umbilicus     Discharge Instructions      Take amoxicillin-clavulanate 875 mg--1 tab twice daily with food for 7 days  Put mupirocin ointment on the sore areas twice daily until improved  Take Tylenol as needed for the discomfort.     ED Prescriptions     Medication Sig  Dispense Auth. Provider   mupirocin ointment (BACTROBAN) 2 % Apply 1 Application topically 2 (two) times daily. To affected area till better 22 g Zenia Resides, MD   amoxicillin-clavulanate (AUGMENTIN) 875-125 MG tablet Take 1 tablet  by mouth 2 (two) times daily for 7 days. 14 tablet Oceanna Arruda, Janace Aris, MD      PDMP not reviewed this encounter.   Zenia Resides, MD 02/14/22 1016

## 2022-02-14 NOTE — ED Triage Notes (Signed)
Pt presents to [facility] with co of belly button drainage/bleeding and discomfort. Pt reports she had an abnormal papsmear and was schuled for a biopsy on 6/14 and they were unable to do it due to pain. Pt reports when she got home she noticed the bleeding. Since then pain intermittently and drainage noted. No itching.

## 2022-02-14 NOTE — Discharge Instructions (Addendum)
Take amoxicillin-clavulanate 875 mg--1 tab twice daily with food for 7 days  Put mupirocin ointment on the sore areas twice daily until improved  Take Tylenol as needed for the discomfort.

## 2022-02-20 ENCOUNTER — Ambulatory Visit: Payer: Self-pay | Admitting: Licensed Clinical Social Worker

## 2022-02-20 ENCOUNTER — Telehealth: Payer: Self-pay | Admitting: Licensed Clinical Social Worker

## 2022-02-20 ENCOUNTER — Encounter: Payer: Self-pay | Admitting: Licensed Clinical Social Worker

## 2022-02-20 DIAGNOSIS — Z1379 Encounter for other screening for genetic and chromosomal anomalies: Secondary | ICD-10-CM | POA: Insufficient documentation

## 2022-02-20 NOTE — Progress Notes (Signed)
HPI:   [name] was previously seen in the [place] clinic due to a family history of cancer and concerns regarding a hereditary predisposition to cancer. Please refer to our prior cancer genetics clinic note for more information regarding our discussion, assessment and recommendations, at the time. [name]'s recent genetic test results were disclosed to her, as were recommendations warranted by these results. These results and recommendations are discussed in more detail below.  CANCER HISTORY:  Oncology History   No history exists.    FAMILY HISTORY:  We obtained a detailed, 4-generation family history.  Significant diagnoses are listed below: Family History  Problem Relation Age of Onset   Obesity Mother    Drug abuse Mother    Schizophrenia Mother    Bipolar disorder Mother    Anxiety disorder Mother    Depression Mother    Cancer Mother        ovarian   Drug abuse Father    Schizophrenia Father    Bipolar disorder Father    Depression Other    Anxiety disorder Other    Bipolar disorder Other    Schizophrenia Other    Drug abuse Other    Obesity Other    Breast cancer Other    Ovarian cancer Half-Sister        dx 21s   [name] was adopted and has limited information about her biological family members. She has 1 twin sister, 2 full sisters, 3 full brothers, 1 maternal half brother, 1 maternal half sister. Her half sister had ovarian cancer in her 2s and is living at 15.   [name]'s biological mother is living in her late 45s and had ovarian cancer, unknown age of diagnosis. Maternal grandmother's sister had breast cancer. Maternal grandmother's father had lung cancer. No other known cancers on this side of the family.   [name] is unaware of any cancers on her biological father's side of the family.    [name] is unaware of previous family history of genetic testing for hereditary cancer risks. There  is no reported Ashkenazi Jewish ancestry. There is no known consanguinity.      GENETIC TEST RESULTS:  The [facility] CancerNext-Expanded+RNA Panel found no pathogenic mutations.   The CancerNext-Expanded + RNAinsight gene panel offered by [employer] and includes sequencing and rearrangement analysis for the following 77 genes: IP, ALK, APC*, ATM*, AXIN2, BAP1, BARD1, BLM, BMPR1A, BRCA1*, BRCA2*, BRIP1*, CDC73, CDH1*,CDK4, CDKN1B, CDKN2A, CHEK2*, CTNNA1, DICER1, FANCC, FH, FLCN, GALNT12, KIF1B, LZTR1, MAX, MEN1, MET, MLH1*, MSH2*, MSH3, MSH6*, MUTYH*, NBN, NF1*, NF2, NTHL1, PALB2*, PHOX2B, PMS2*, POT1, PRKAR1A, PTCH1, PTEN*, RAD51C*, RAD51D*,RB1, RECQL, RET, SDHA, SDHAF2, SDHB, SDHC, SDHD, SMAD4, SMARCA4, SMARCB1, SMARCE1, STK11, SUFU, TMEM127, TP53*,TSC1, TSC2, VHL and XRCC2 (sequencing and deletion/duplication); EGFR, EGLN1, HOXB13, KIT, MITF, PDGFRA, POLD1 and POLE (sequencing only); EPCAM and GREM1 (deletion/duplication only).   The test report has been scanned into EPIC and is located under the Molecular Pathology section of the Results Review tab.  A portion of the result report is included below for reference. Genetic testing reported out on 02/19/2022.      Even though a pathogenic variant was not identified, possible explanations for the cancer in the family may include: There may be no hereditary risk for cancer in the family. The cancers in her family may be sporadic/familial or due to other genetic and environmental factors. There may be a gene mutation in one of these  genes that current testing methods cannot detect but that chance is small. There could be another gene that has not yet been discovered, or that we have not yet tested, that is responsible for the cancer diagnoses in the family.  It is also possible there is a hereditary cause for the cancer in the family that [name] did not inherit.  Therefore, it is important to remain in touch with cancer genetics in the  future so that we can continue to offer [name] the most up to date genetic testing.   ADDITIONAL GENETIC TESTING:  We discussed with [name] that her genetic testing was fairly extensive.  If there are additional relevant genes identified to increase cancer risk that can be analyzed in the future, we would be happy to discuss and coordinate this testing at that time.    CANCER SCREENING RECOMMENDATIONS:  [name]'s test result is considered negative (normal).  This means that we have not identified a hereditary cause for her family history of cancer at this time.   An individual's cancer risk and medical management are not determined by genetic test results alone. Overall cancer risk assessment incorporates additional factors, including personal medical history, family history, and any available genetic information that may result in a personalized plan for cancer prevention and surveillance. Therefore, it is recommended she continue to follow the cancer management and screening guidelines provided by her primary healthcare provider.  Based on the reported personal and family history, specific cancer screenings for [name] and her family include:  Breast Cancer Screening:  The Tyrer-Cuzick model is one of multiple prediction models developed to estimate an individual's lifetime risk of developing breast cancer. The Tyrer-Cuzick model is endorsed by the [employer]  (NCCN). This model includes many risk factors such as family history, endogenous estrogen exposure, and benign breast disease. The calculation is highly-dependent on the accuracy of clinical data provided by the patient and can change over time. The Tyrer-Cuzick model may be repeated to reflect new information in her personal or family history in the future.    [name]'s Tyrer-Cuzick risk score is 13.7%.        RECOMMENDATIONS FOR FAMILY MEMBERS:    Individuals in this family might be at some increased risk of developing cancer, over the general population risk, due to the family history of cancer.  Individuals in the family should notify their providers of the family history of cancer. We recommend women in this family have a yearly mammogram beginning at age 18, or 44 years younger than the earliest onset of cancer, an annual clinical breast exam, and perform monthly breast self-exams.  Family members should have colonoscopies by at age 26, or earlier, as recommended by their providers. Other members of the family may still carry a pathogenic variant in one of these genes that [name] did not inherit. Based on the family history, we recommend her biological mother and half sister who have had ovarian cancer to have genetic counseling and testing. [name] will let us know if we can be of any assistance in coordinating genetic counseling and/or testing for this family member.    FOLLOW-UP:  Lastly, we discussed with [name] that cancer genetics is a rapidly advancing field and it is possible that new genetic tests will be appropriate for her and/or her family members in the future. We encouraged her to remain in contact with cancer genetics  on an annual basis so we can update her personal and family histories and let her know of advances in cancer genetics that may benefit this family.   Our contact number was provided. [name]'s questions were answered to her satisfaction, and she knows she is welcome to call us at anytime with additional questions or concerns.    Faith Rogue, MS, [facility] Genetic Counselor [email or web address] Phone: [phone]

## 2022-02-20 NOTE — Telephone Encounter (Signed)
I contacted Ms. Verlan Friends to discuss her genetic testing results. No pathogenic variants were identified in the 77 genes analyzed. Detailed clinic note to follow.   The test report has been scanned into EPIC and is located under the Molecular Pathology section of the Results Review tab.  A portion of the result report is included below for reference.      Lacy Duverney, MS, [facility] Genetic Counselor [email or web address] Phone: [phone]

## 2022-02-21 ENCOUNTER — Ambulatory Visit (INDEPENDENT_AMBULATORY_CARE_PROVIDER_SITE_OTHER): Payer: BC Managed Care – PPO | Admitting: Licensed Clinical Social Worker

## 2022-02-21 DIAGNOSIS — F411 Generalized anxiety disorder: Secondary | ICD-10-CM
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
## 2022-02-24 DIAGNOSIS — R87619 Unspecified abnormal cytological findings in specimens from cervix uteri: Secondary | ICD-10-CM

## 2022-02-24 DIAGNOSIS — B977 Papillomavirus as the cause of diseases classified elsewhere: Secondary | ICD-10-CM

## 2022-02-24 NOTE — H&P (Signed)
Lori Clements is an 26 y.o. G0P0000 who is admitted for Colposcopy, cervical biopsy, endocervical curettage, and Hysteroscopy with Dilation and Curettage with Myosure for Atypical Endocervical Cells (AEC) with high risk HPV pap smear. Patient was unable to tolerate speculum exam in the office and thus opted to have procedures performed under anesthesia.  Pap smear history: 12/17/2021: Atypical Endocervical Cells (AEC-NOS) with high risk HPV (negative 16, 18/45) 02/2020: Normal, per patient  Contraception: Nexplanon (placed 2021)  Patient Active Problem List   Diagnosis Date Noted   Morbid obesity with body mass index (BMI) of 50.0 to 59.9 in adult [facility]) 02/24/2022   Atypical endocervical cells on cervical Papanicolaou smear 02/24/2022   High risk HPV infection 02/24/2022   Genetic testing 02/20/2022    MEDICAL/FAMILY/SOCIAL HX: No LMP recorded.    Past Medical History:  Diagnosis Date   Anxiety    Back pain    Depression    Essential hypertension    GERD (gastroesophageal reflux disease)    IBS (irritable bowel syndrome)    Other fatigue    Shortness of breath on exertion    Sleep apnea    Thyroid disease     Past Surgical History:  Procedure Laterality Date   WISDOM TOOTH EXTRACTION N/A 2013    Family History  Problem Relation Age of Onset   Obesity Mother    Drug abuse Mother    Schizophrenia Mother    Bipolar disorder Mother    Anxiety disorder Mother    Depression Mother    Cancer Mother        ovarian   Drug abuse Father    Schizophrenia Father    Bipolar disorder Father    Depression Other    Anxiety disorder Other    Bipolar disorder Other    Schizophrenia Other    Drug abuse Other    Obesity Other    Breast cancer Other    Ovarian cancer Half-Sister        dx 40s    Social History:  reports that she has never smoked. She has never used smokeless tobacco. She reports that she does not drink alcohol and does not use  drugs.  ALLERGIES/MEDS:  Allergies:  Allergies  Allergen Reactions   Naproxen     hematuria    No medications prior to admission.     Review of Systems  Constitutional: Negative.   HENT: Negative.    Eyes: Negative.   Respiratory: Negative.    Cardiovascular: Negative.   Gastrointestinal: Negative.   Genitourinary: Negative.   Musculoskeletal: Negative.   Skin: Negative.   Neurological: Negative.   Endo/Heme/Allergies: Negative.   Psychiatric/Behavioral: Negative.      There were no vitals taken for this visit. Gen:  NAD, pleasant and cooperative Cardio:  RRR Pulm:  CTAB, no wheezes/rales/rhonchi Abd:  Soft, non-distended, non-tender throughout, no rebound/guarding Ext:  No bilateral LE edema, no bilateral calf tenderness Pelvic: Poor tolerance of exam, unable to visualize cervix  No results found for this or any previous visit (from the past 24 hour(s)).  No results found.   ASSESSMENT/PLAN: Lori Clements is a 26 y.o. G0P0000 who is admitted for who is admitted for Colposcopy, cervical biopsy, endocervical curettage, and Hysteroscopy with Dilation and Curettage with Myosure for Atypical Endocervical Cells (AEC) with high risk HPV pap smear.  - Admit to [facility] OR - Admit labs (CBC, T&S, COVID screen per protocol) - Diet:  Per anesthesia - IVF:  Per anesthesia -  VTE Prophylaxis:  SCDs - Antibiotics: None - Anticipate same-day discharge  Consents: I have discussed with the patient that colposcopy is performed to evaluate for cervical dysplasia. Risks include infection, blood loss, and pain after surgery. I discussed with the patient that this surgery is performed to look inside the uterus and remove the uterine lining.  Prior to surgery, the risks and benefits of the surgery, as well as alternative treatments, have been discussed.  The risks include, but are not limited to bleeding, including the need for a blood transfusion, infection, damage to organs and  tissues, including uterine perforation, requiring additional surgery, postoperative pain, short-term and long-term, failure of the procedure to control symptoms, need for hysterectomy to control bleeding, fluid overload, which could create electrolyte abnormalities and the need to stop the procedure before completion, inability to safely complete the procedure, deep vein thrombosis and/or pulmonary embolism, painful intercourse, complications the course of which cannot be predicted or prevented, and death.  Patient was consented for blood products.  The patient is aware that bleeding may result in the need for a blood transfusion which includes risk of transmission of HIV (1:2 million), Hepatitis C (1:2 million), and Hepatitis B (1:200 thousand) and transfusion reaction.  Patient voiced understanding of the above risks as well as understanding of indications for blood transfusion.  Steva Ready, DO

## 2022-02-28 DIAGNOSIS — Z01818 Encounter for other preprocedural examination: Secondary | ICD-10-CM | POA: Diagnosis not present

## 2022-02-28 DIAGNOSIS — R87619 Unspecified abnormal cytological findings in specimens from cervix uteri: Secondary | ICD-10-CM | POA: Diagnosis not present

## 2022-03-11 ENCOUNTER — Other Ambulatory Visit (HOSPITAL_COMMUNITY): Payer: Self-pay

## 2022-03-11 ENCOUNTER — Encounter (HOSPITAL_COMMUNITY): Payer: Self-pay | Admitting: Obstetrics and Gynecology

## 2022-03-11 NOTE — Pre-Procedure Instructions (Signed)
PCP - Mcarthur Rossetti  Cardiologist - n/a  EKG - 08/21/21 Chest x-ray - 09/28/21 ECHO - n/a Cardiac Cath - na/ CPAP - yes  Fasting Blood Sugar:  120 Checks Blood Sugar:  1/day  Blood Thinner Instructions: n/a Aspirin Instructions: na/  ERAS Protcol - yes COVID TEST- no  Anesthesia review: yes -elevated  AST, ALT last lab work  -------------  SDW INSTRUCTIONS:  Your procedure is scheduled on 03/13/22. Please report to [facility] Main Entrance "A" at 0800 A.M., and check in at the Admitting office. Call this number if you have problems the morning of surgery: [phone]   Remember: Do not eat  after midnight the night before your surgery  You may drink clear liquids until 0700 the morning of your surgery.   Clear liquids allowed are: Water, Non-Citrus Juices (without pulp), Carbonated Beverages, Clear Tea, Black Coffee Only, and Gatorade   Medications to take morning of surgery with a sip of water include: zoloft, pepcid  As of today, STOP taking any Aspirin (unless otherwise instructed by your surgeon), Aleve, Naproxen, Ibuprofen, Motrin, Advil, Goody's, BC's, all herbal medications, fish oil, and all vitamins.  WHAT DO I DO ABOUT MY DIABETES MEDICATION?   Do not take Metformin the morning of surgery.  HOW TO MANAGE YOUR DIABETES BEFORE AND AFTER SURGERY  Why is it important to control my blood sugar before and after surgery? Improving blood sugar levels before and after surgery helps healing and can limit problems. A way of improving blood sugar control is eating a healthy diet by:  Eating less sugar and carbohydrates  Increasing activity/exercise  Talking with your doctor about reaching your blood sugar goals High blood sugars (greater than 180 mg/dL) can raise your risk of infections and slow your recovery, so you will need to focus on controlling your diabetes during the weeks before surgery. Make sure that the doctor who takes care of your diabetes knows about your  planned surgery including the date and location.  How do I manage my blood sugar before surgery? Check your blood sugar at least 4 times a day, starting 2 days before surgery, to make sure that the level is not too high or low.  Check your blood sugar the morning of your surgery when you wake up and every 2 hours until you get to the Short Stay unit.  If your blood sugar is less than 70 mg/dL, you will need to treat for low blood sugar: Do not take insulin. Treat a low blood sugar (less than 70 mg/dL) with  cup of clear juice (cranberry or apple), 4 glucose tablets, OR glucose gel. Recheck blood sugar in 15 minutes after treatment (to make sure it is greater than 70 mg/dL). If your blood sugar is not greater than 70 mg/dL on recheck, call [phone] for further instructions. Report your blood sugar to the short stay nurse when you get to Short Stay.  If you are admitted to the hospital after surgery: Your blood sugar will be checked by the staff and you will probably be given insulin after surgery (instead of oral diabetes medicines) to make sure you have good blood sugar levels. The goal for blood sugar control after surgery is 80-180 mg/dL.   The Morning of Surgery Do not wear jewelry, make-up or nail polish. Do not wear lotions, powders, or perfumes/colognes, or deodorant Do not bring valuables to the hospital. [facility] is not responsible for any belongings or valuables.  If you are a smoker,  DO NOT Smoke 24 hours prior to surgery  If you wear a CPAP at night please bring your mask the morning of surgery   Remember that you must have someone to transport you home after your surgery, and remain with you for 24 hours if you are discharged the same day.  Please bring cases for contacts, glasses, hearing aids, dentures or bridgework because it cannot be worn into surgery.   Patients discharged the day of surgery will not be allowed to drive home.   Please shower the NIGHT  BEFORE/MORNING OF SURGERY (use antibacterial soap like DIAL soap if possible). Wear comfortable clothes the morning of surgery. Oral Hygiene is also important to reduce your risk of infection.  Remember - BRUSH YOUR TEETH THE MORNING OF SURGERY WITH YOUR REGULAR TOOTHPASTE  Patient denies shortness of breath, fever, cough and chest pain.

## 2022-03-12 ENCOUNTER — Encounter (HOSPITAL_COMMUNITY): Payer: Self-pay | Admitting: Obstetrics and Gynecology

## 2022-03-12 NOTE — Anesthesia Preprocedure Evaluation (Signed)
Anesthesia Evaluation  Patient identified by MRN, date of birth, ID band Patient awake    Reviewed: Allergy & Precautions, NPO status , Patient's Chart, lab work & pertinent test results  Airway Mallampati: III  TM Distance: >3 FB Neck ROM: Full    Dental no notable dental hx.    Pulmonary sleep apnea and Continuous Positive Airway Pressure Ventilation ,    Pulmonary exam normal breath sounds clear to auscultation       Cardiovascular hypertension, Pt. on medications Normal cardiovascular exam Rhythm:Regular Rate:Normal     Neuro/Psych PSYCHIATRIC DISORDERS Anxiety Depression negative neurological ROS     GI/Hepatic Neg liver ROS, GERD  Controlled and Medicated,  Endo/Other  diabetes, Well Controlled, Type 2, Oral Hypoglycemic AgentsMorbid obesitya1c 6.8, but FS 201 in preop BMI 56  Renal/GU negative Renal ROS     Musculoskeletal negative musculoskeletal ROS (+)   Abdominal (+) + obese,   Peds  Hematology negative hematology ROS (+)   Anesthesia Other Findings   Reproductive/Obstetrics negative OB ROS                           Anesthesia Physical Anesthesia Plan  ASA: 3  Anesthesia Plan: General   Post-op Pain Management: Tylenol PO (pre-op)*, Toradol IV (intra-op)* and Precedex   Induction: Intravenous  PONV Risk Score and Plan: Ondansetron, Dexamethasone, Midazolam and Treatment may vary due to age or medical condition  Airway Management Planned: Oral ETT  Additional Equipment: None  Intra-op Plan:   Post-operative Plan: Extubation in OR  Informed Consent: I have reviewed the patients History and Physical, chart, labs and discussed the procedure including the risks, benefits and alternatives for the proposed anesthesia with the patient or authorized representative who has indicated his/her understanding and acceptance.     Dental advisory given  Plan Discussed with:  CRNA  Anesthesia Plan Comments:        Anesthesia Quick Evaluation

## 2022-03-12 NOTE — Progress Notes (Signed)
Anesthesia Chart Review: SAME DAY WORK-UP  Case: [id number] Date/Time: 03/13/22 1020   Procedures:      DILATATION & CURETTAGE/HYSTEROSCOPY WITH MYOSURE - rep will be here confirmed on 9/11 CS     COLPOSCOPY WITH ENDOCERVICAL CURETTAGE   Anesthesia type: Choice   Pre-op diagnosis: Atypical endocervical cells on pap smear   [facility] ROOM 07 / [facility]   Surgeons: Steva Ready, DO       DISCUSSION: Patient is a 26 year old female scheduled for the above procedure.  History includes never smoker, HTN, GERD, OSA (CPAP), IBS, exertional dyspnea, thyroid disease (not specified), anxiety, back pain, morbid obesity. She was diagnosed with DM2 and NALFD by Dr. Sharee Holster following labs on 08/21/21.  She has mildly elevated AST and ALT at [facility] in 07/[facility]21. TSH and Free T4 were normal then with elevated total T3 208 (71-208). A1c was 6.8% consistent with diabetes range. Per 09/24/21 follow-up with Dr. Sharee Holster, she reviewed A1c results within DM2 range and also diagnosed her with NAFLD. Documented "No ETOH or excessive tylenol use. Visceral fat rating is 18."  She discussed  lifestyle dietary changes and consideration of GLP-1 in the future. Patient will get updated labs as indicated on arrival for surgery.   Anesthesia team to evaluate on the day of surgery.   VS: Ht 5\' 6"  (1.676 m)   Wt (!) 157.4 kg   LMP 08/22/2021   BMI 56.01 kg/m  BP Readings from Last 3 Encounters:  02/14/22 (!) 161/93  09/24/21 (!) 162/90  08/21/21 133/84   Pulse Readings from Last 3 Encounters:  02/14/22 68  09/24/21 71  08/21/21 78     PROVIDERS: 08/23/21, NP is PCP  Carilyn Goodpasture, DO is provider at [facility].  Last visit 09/24/21. - She saw general surgeon 11/24/21, MD on 09/06/21 for consideration of bariatric surgery evaluation. She has a Nutrition Diabetes management visit scheduled for 04/15/22.    LABS: For day of surgery as indicated.  Last  results in [facility] from 08/21/21 (ordered per Dr. 08/23/21) showed: LFTs were mildly elevated with normal PLT count.  Lab Results  Component Value Date   WBC 8.0 08/21/2021   HGB 15.0 08/21/2021   HCT 43.8 08/21/2021   PLT 291 08/21/2021   GLUCOSE 135 (H) 08/21/2021   CHOL 160 08/21/2021   TRIG 145 08/21/2021   HDL 35 (L) 08/21/2021   LDLCALC 99 08/21/2021   ALT 68 (H) 08/21/2021   AST 58 (H) 08/21/2021   NA 137 08/21/2021   K 4.0 08/21/2021   CL 100 08/21/2021   CREATININE 0.78 08/21/2021   BUN 13 08/21/2021   CO2 21 08/21/2021   TSH 2.170 08/21/2021   HGBA1C 6.8 (H) 08/21/2021     IMAGES: CXR 09/27/21: FINDINGS: The heart size and mediastinal contours are within normal limits. Both lungs are clear. The visualized skeletal structures are unremarkable. IMPRESSION: No active cardiopulmonary disease.  DG UGI 09/27/21: IMPRESSION: Unremarkable upper GI series.  EKG: EKG 08/21/21: NSR   CV: N/A  Past Medical History:  Diagnosis Date   Anxiety    Back pain    Depression    Diabetes mellitus without complication (HCC) 08/21/2021   08/21/21 A1c 6.8%   Elevated liver function tests 08/16/2021   Essential hypertension    GERD (gastroesophageal reflux disease)    IBS (irritable bowel syndrome)    Other fatigue    Shortness of breath on exertion  Sleep apnea    Thyroid disease     Past Surgical History:  Procedure Laterality Date   WISDOM TOOTH EXTRACTION N/A 2013    MEDICATIONS: No current facility-administered medications for this encounter.    Cholecalciferol (VITAMIN D) 125 MCG (5000 UT) CAPS   Etonogestrel (IMPLANON [place])   famotidine (PEPCID) 20 MG tablet   losartan (COZAAR) 50 MG tablet   Melatonin 10 MG TABS   metFORMIN (GLUCOPHAGE) 500 MG tablet   sertraline (ZOLOFT) 100 MG tablet   traZODone (DESYREL) 50 MG tablet   mupirocin ointment (BACTROBAN) 2 %   Vitamin D, Ergocalciferol, (DRISDOL) 1.25 MG (50000 UNIT) CAPS capsule    Myra Gianotti,  PA-C Surgical Short Stay/Anesthesiology [facility] Phone [phone] [facility] Phone [phone] 03/12/2022 2:06 PM

## 2022-03-13 ENCOUNTER — Ambulatory Visit (HOSPITAL_COMMUNITY)
Admission: RE | Admit: 2022-03-13 | Discharge: 2022-03-13 | Disposition: A | Discharge status: Home / Self Care | Payer: BC Managed Care – PPO | Source: Ambulatory Visit | Attending: Obstetrics and Gynecology | Admitting: Obstetrics and Gynecology

## 2022-03-13 ENCOUNTER — Other Ambulatory Visit: Payer: Self-pay

## 2022-03-13 ENCOUNTER — Ambulatory Visit (HOSPITAL_COMMUNITY): Payer: BC Managed Care – PPO | Admitting: Vascular Surgery

## 2022-03-13 ENCOUNTER — Encounter (HOSPITAL_COMMUNITY): Payer: Self-pay | Admitting: Obstetrics and Gynecology

## 2022-03-13 ENCOUNTER — Encounter (HOSPITAL_COMMUNITY)
Admission: RE | Disposition: A | Discharge status: Home / Self Care | Payer: Self-pay | Source: Ambulatory Visit | Attending: Obstetrics and Gynecology

## 2022-03-13 DIAGNOSIS — E119 Type 2 diabetes mellitus without complications: Secondary | ICD-10-CM | POA: Diagnosis not present

## 2022-03-13 DIAGNOSIS — R87810 Cervical high risk human papillomavirus (HPV) DNA test positive: Secondary | ICD-10-CM | POA: Diagnosis not present

## 2022-03-13 DIAGNOSIS — K219 Gastro-esophageal reflux disease without esophagitis: Secondary | ICD-10-CM | POA: Insufficient documentation

## 2022-03-13 DIAGNOSIS — Z7984 Long term (current) use of oral hypoglycemic drugs: Secondary | ICD-10-CM | POA: Insufficient documentation

## 2022-03-13 DIAGNOSIS — F418 Other specified anxiety disorders: Secondary | ICD-10-CM | POA: Insufficient documentation

## 2022-03-13 DIAGNOSIS — B977 Papillomavirus as the cause of diseases classified elsewhere: Secondary | ICD-10-CM | POA: Diagnosis not present

## 2022-03-13 DIAGNOSIS — R87619 Unspecified abnormal cytological findings in specimens from cervix uteri: Secondary | ICD-10-CM | POA: Diagnosis not present

## 2022-03-13 DIAGNOSIS — D069 Carcinoma in situ of cervix, unspecified: Secondary | ICD-10-CM | POA: Insufficient documentation

## 2022-03-13 DIAGNOSIS — I10 Essential (primary) hypertension: Secondary | ICD-10-CM | POA: Diagnosis not present

## 2022-03-13 DIAGNOSIS — G4730 Sleep apnea, unspecified: Secondary | ICD-10-CM | POA: Diagnosis not present

## 2022-03-13 DIAGNOSIS — Z79899 Other long term (current) drug therapy: Secondary | ICD-10-CM | POA: Diagnosis not present

## 2022-03-13 DIAGNOSIS — N72 Inflammatory disease of cervix uteri: Secondary | ICD-10-CM | POA: Insufficient documentation

## 2022-03-13 DIAGNOSIS — R748 Abnormal levels of other serum enzymes: Secondary | ICD-10-CM

## 2022-03-13 DIAGNOSIS — R87610 Atypical squamous cells of undetermined significance on cytologic smear of cervix (ASC-US): Secondary | ICD-10-CM | POA: Diagnosis not present

## 2022-03-13 DIAGNOSIS — Z6841 Body Mass Index (BMI) 40.0 and over, adult: Secondary | ICD-10-CM | POA: Diagnosis not present

## 2022-03-13 DIAGNOSIS — E669 Obesity, unspecified: Secondary | ICD-10-CM | POA: Diagnosis not present

## 2022-03-13 HISTORY — PX: DILATATION & CURETTAGE/HYSTEROSCOPY WITH MYOSURE: SHX6511

## 2022-03-13 HISTORY — PX: COLPOSCOPY: SHX161

## 2022-03-13 LAB — COMPREHENSIVE METABOLIC PANEL
ALT: 53 U/L — ABNORMAL HIGH (ref 0–44)
AST: 45 U/L — ABNORMAL HIGH (ref 15–41)
Albumin: 3.2 g/dL — ABNORMAL LOW (ref 3.5–5.0)
Alkaline Phosphatase: 113 U/L (ref 38–126)
Anion gap: 9 (ref 5–15)
BUN: 9 mg/dL (ref 6–20)
CO2: 23 mmol/L (ref 22–32)
Calcium: 8.9 mg/dL (ref 8.9–10.3)
Chloride: 105 mmol/L (ref 98–111)
Creatinine, Ser: 0.69 mg/dL (ref 0.44–1.00)
GFR, Estimated: >60 mL/min (ref >60)
Glucose, Bld: 198 mg/dL — ABNORMAL HIGH (ref 70–99)
Potassium: 3.9 mmol/L (ref 3.5–5.1)
Sodium: 137 mmol/L (ref 135–145)
Total Bilirubin: 0.7 mg/dL (ref 0.3–1.2)
Total Protein: 7.2 g/dL (ref 6.5–8.1)

## 2022-03-13 LAB — GLUCOSE, CAPILLARY
Glucose-Capillary: 201 mg/dL — ABNORMAL HIGH (ref 70–99)
Glucose-Capillary: 160 mg/dL — ABNORMAL HIGH (ref 70–99)
Glucose-Capillary: 116 mg/dL — ABNORMAL HIGH (ref 70–99)

## 2022-03-13 LAB — POCT PREGNANCY, URINE: Preg Test, Ur: NEGATIVE

## 2022-03-13 LAB — CBC
HCT: 41.5 % (ref 36.0–46.0)
Hemoglobin: 14 g/dL (ref 12.0–15.0)
MCH: 29.8 pg (ref 26.0–34.0)
MCHC: 33.7 g/dL (ref 30.0–36.0)
MCV: 88.3 fL (ref 80.0–100.0)
Platelets: 278 10*3/uL (ref 150–400)
RBC: 4.7 MIL/uL (ref 3.87–5.11)
RDW: 13.2 % (ref 11.5–15.5)
WBC: 7.8 10*3/uL (ref 4.0–10.5)
nRBC: 0 % (ref 0.0–0.2)

## 2022-03-13 LAB — ABO/RH: ABO/RH(D): O POS

## 2022-03-13 LAB — TYPE AND SCREEN
ABO/RH(D): O POS
Antibody Screen: NEGATIVE

## 2022-03-13 SURGERY — DILATATION & CURETTAGE/HYSTEROSCOPY WITH MYOSURE
Anesthesia: General | Site: Uterus

## 2022-03-13 MED ORDER — DEXAMETHASONE SODIUM PHOSPHATE 10 MG/ML IJ SOLN
INTRAMUSCULAR | Status: AC
  Filled 2022-03-13: qty 1

## 2022-03-13 MED ORDER — HYDROMORPHONE HCL 1 MG/ML IJ SOLN: 0.2500 mg | INTRAMUSCULAR | Status: DC | PRN

## 2022-03-13 MED ORDER — MIDAZOLAM HCL 2 MG/2ML IJ SOLN
INTRAMUSCULAR | Status: DC | PRN
  Administered 2022-03-13: 2 mg via INTRAVENOUS

## 2022-03-13 MED ORDER — ONDANSETRON HCL 4 MG/2ML IJ SOLN: 4.0000 mg | Freq: Once | INTRAMUSCULAR | Status: DC | PRN

## 2022-03-13 MED ORDER — FENTANYL CITRATE (PF) 250 MCG/5ML IJ SOLN
INTRAMUSCULAR | Status: AC
  Filled 2022-03-13: qty 5

## 2022-03-13 MED ORDER — ACETAMINOPHEN 500 MG PO TABS
ORAL_TABLET | ORAL | Status: AC
  Administered 2022-03-13: 1000 mg via ORAL
  Filled 2022-03-13: qty 2

## 2022-03-13 MED ORDER — SUCCINYLCHOLINE CHLORIDE 200 MG/10ML IV SOSY
PREFILLED_SYRINGE | INTRAVENOUS | Status: DC | PRN
  Administered 2022-03-13: 140 mg via INTRAVENOUS

## 2022-03-13 MED ORDER — LIDOCAINE 2% (20 MG/ML) 5 ML SYRINGE
INTRAMUSCULAR | Status: AC
  Filled 2022-03-13: qty 5

## 2022-03-13 MED ORDER — CHLORHEXIDINE GLUCONATE 0.12 % MT SOLN
OROMUCOSAL | Status: AC
  Filled 2022-03-13: qty 15

## 2022-03-13 MED ORDER — ONDANSETRON HCL 4 MG/2ML IJ SOLN
INTRAMUSCULAR | Status: AC
  Filled 2022-03-13: qty 2

## 2022-03-13 MED ORDER — KETOROLAC TROMETHAMINE 30 MG/ML IJ SOLN
30.0000 mg | Freq: Once | INTRAMUSCULAR | Status: AC | PRN
  Administered 2022-03-13: 30 mg via INTRAVENOUS

## 2022-03-13 MED ORDER — SCOPOLAMINE 1 MG/3DAYS TD PT72
MEDICATED_PATCH | TRANSDERMAL | Status: AC
  Administered 2022-03-13: 1.5 mg via TRANSDERMAL
  Filled 2022-03-13: qty 1

## 2022-03-13 MED ORDER — ACETIC ACID 4% SOLUTION
Status: DC | PRN
  Administered 2022-03-13: 1 via TOPICAL

## 2022-03-13 MED ORDER — SCOPOLAMINE 1 MG/3DAYS TD PT72: 1.0000 | MEDICATED_PATCH | TRANSDERMAL | Status: DC

## 2022-03-13 MED ORDER — ACETIC ACID 5 % SOLN
Status: AC
  Filled 2022-03-13: qty 500

## 2022-03-13 MED ORDER — MIDAZOLAM HCL 2 MG/2ML IJ SOLN
INTRAMUSCULAR | Status: AC
  Filled 2022-03-13: qty 2

## 2022-03-13 MED ORDER — ROCURONIUM BROMIDE 10 MG/ML (PF) SYRINGE
PREFILLED_SYRINGE | INTRAVENOUS | Status: AC
  Filled 2022-03-13: qty 10

## 2022-03-13 MED ORDER — ACETAMINOPHEN 500 MG PO TABS: 1000.0000 mg | ORAL_TABLET | Freq: Once | ORAL | Status: AC

## 2022-03-13 MED ORDER — CHLORHEXIDINE GLUCONATE 0.12 % MT SOLN
15.0000 mL | Freq: Once | OROMUCOSAL | Status: AC
  Administered 2022-03-13: 15 mL via OROMUCOSAL

## 2022-03-13 MED ORDER — FERRIC SUBSULFATE (BULK) SOLN
Status: DC | PRN
  Administered 2022-03-13: 1 via TOPICAL

## 2022-03-13 MED ORDER — PROPOFOL 10 MG/ML IV BOLUS
INTRAVENOUS | Status: AC
  Filled 2022-03-13: qty 20

## 2022-03-13 MED ORDER — ORAL CARE MOUTH RINSE: 15.0000 mL | Freq: Once | OROMUCOSAL | Status: AC

## 2022-03-13 MED ORDER — ROCURONIUM BROMIDE 10 MG/ML (PF) SYRINGE
PREFILLED_SYRINGE | INTRAVENOUS | Status: DC | PRN
  Administered 2022-03-13: 40 mg via INTRAVENOUS

## 2022-03-13 MED ORDER — KETOROLAC TROMETHAMINE 30 MG/ML IJ SOLN
INTRAMUSCULAR | Status: AC
  Filled 2022-03-13: qty 1

## 2022-03-13 MED ORDER — FERRIC SUBSULFATE 259 MG/GM EX SOLN
CUTANEOUS | Status: AC
  Filled 2022-03-13: qty 8

## 2022-03-13 MED ORDER — PROPOFOL 10 MG/ML IV BOLUS
INTRAVENOUS | Status: DC | PRN
  Administered 2022-03-13: 200 mg via INTRAVENOUS

## 2022-03-13 MED ORDER — IBUPROFEN 800 MG PO TABS: 800.0000 mg | ORAL_TABLET | Freq: Three times a day (TID) | ORAL | 0 refills | Status: DC | PRN

## 2022-03-13 MED ORDER — INSULIN ASPART 100 UNIT/ML IJ SOLN
0.0000 [IU] | INTRAMUSCULAR | Status: DC | PRN
  Administered 2022-03-13: 4 [IU] via SUBCUTANEOUS
  Filled 2022-03-13: qty 1

## 2022-03-13 MED ORDER — IODINE STRONG (LUGOLS) 5 % PO SOLN
ORAL | Status: AC
  Filled 2022-03-13: qty 1

## 2022-03-13 MED ORDER — SUGAMMADEX SODIUM 200 MG/2ML IV SOLN
INTRAVENOUS | Status: DC | PRN
  Administered 2022-03-13: 200 mg via INTRAVENOUS

## 2022-03-13 MED ORDER — OXYCODONE HCL 5 MG PO TABS: 5.0000 mg | ORAL_TABLET | Freq: Once | ORAL | Status: DC | PRN

## 2022-03-13 MED ORDER — SUCCINYLCHOLINE CHLORIDE 200 MG/10ML IV SOSY
PREFILLED_SYRINGE | INTRAVENOUS | Status: AC
  Filled 2022-03-13: qty 10

## 2022-03-13 MED ORDER — LACTATED RINGERS IV SOLN: INTRAVENOUS | Status: DC | PRN

## 2022-03-13 MED ORDER — AMISULPRIDE (ANTIEMETIC) 5 MG/2ML IV SOLN: 10.0000 mg | Freq: Once | INTRAVENOUS | Status: DC | PRN

## 2022-03-13 MED ORDER — DEXMEDETOMIDINE HCL IN NACL 80 MCG/20ML IV SOLN
INTRAVENOUS | Status: DC | PRN
  Administered 2022-03-13: 12 ug via BUCCAL
  Administered 2022-03-13: 8 ug via BUCCAL

## 2022-03-13 MED ORDER — MEPERIDINE HCL 25 MG/ML IJ SOLN: 6.2500 mg | INTRAMUSCULAR | Status: DC | PRN

## 2022-03-13 MED ORDER — DEXAMETHASONE SODIUM PHOSPHATE 10 MG/ML IJ SOLN
INTRAMUSCULAR | Status: DC | PRN
  Administered 2022-03-13: 5 mg via INTRAVENOUS

## 2022-03-13 MED ORDER — LACTATED RINGERS IV SOLN: INTRAVENOUS | Status: DC

## 2022-03-13 MED ORDER — SODIUM CHLORIDE 0.9 % IR SOLN
Status: DC | PRN
  Administered 2022-03-13: 3000 mL

## 2022-03-13 MED ORDER — FENTANYL CITRATE (PF) 250 MCG/5ML IJ SOLN
INTRAMUSCULAR | Status: DC | PRN
  Administered 2022-03-13 (×2): 50 ug via INTRAVENOUS

## 2022-03-13 MED ORDER — OXYCODONE HCL 5 MG/5ML PO SOLN: 5.0000 mg | Freq: Once | ORAL | Status: DC | PRN

## 2022-03-13 MED ORDER — ONDANSETRON HCL 4 MG/2ML IJ SOLN
INTRAMUSCULAR | Status: DC | PRN
  Administered 2022-03-13: 4 mg via INTRAVENOUS

## 2022-03-13 SURGICAL SUPPLY — 27 items
APL SWBSTK 6 STRL LF DISP (MISCELLANEOUS) ×2
APPLICATOR COTTON TIP 6 STRL (MISCELLANEOUS) ×3 IMPLANT
APPLICATOR COTTON TIP 6IN STRL (MISCELLANEOUS) ×2
CANISTER SUCT 3000ML PPV (MISCELLANEOUS) ×2 IMPLANT
CATH ROBINSON RED A/P 16FR (CATHETERS) ×3 IMPLANT
CNTNR URN SCR LID CUP LEK RST (MISCELLANEOUS) IMPLANT
CONT SPEC 4OZ STRL OR WHT (MISCELLANEOUS) ×2
DEVICE MYOSURE LITE (MISCELLANEOUS) IMPLANT
DEVICE MYOSURE REACH (MISCELLANEOUS) IMPLANT
DILATOR CANAL MILEX (MISCELLANEOUS) IMPLANT
GLOVE BIO SURGEON STRL SZ 6.5 (GLOVE) ×3 IMPLANT
GLOVE BIOGEL PI IND STRL 6.5 (GLOVE) ×3 IMPLANT
GLOVE BIOGEL PI IND STRL 7.0 (GLOVE) ×6 IMPLANT
GLOVE SURG ENC TEXT LTX SZ6.5 (GLOVE) ×2 IMPLANT
GLOVE SURG UNDER POLY LF SZ7 (GLOVE) ×3 IMPLANT
GOWN STRL REUS W/ TWL LRG LVL3 (GOWN DISPOSABLE) ×6 IMPLANT
GOWN STRL REUS W/TWL LRG LVL3 (GOWN DISPOSABLE) ×4
KIT PROCEDURE FLUENT (KITS) ×3 IMPLANT
KIT TURNOVER KIT B (KITS) ×3 IMPLANT
NS IRRIG 1000ML POUR BTL (IV SOLUTION) ×2 IMPLANT
PACK VAGINAL MINOR WOMEN LF (CUSTOM PROCEDURE TRAY) ×3 IMPLANT
PAD OB MATERNITY 4.3X12.25 (PERSONAL CARE ITEMS) ×2 IMPLANT
SCOPETTES 8  STERILE (MISCELLANEOUS) ×2
SCOPETTES 8 STERILE (MISCELLANEOUS) IMPLANT
SEAL ROD LENS SCOPE MYOSURE (ABLATOR) ×2 IMPLANT
TOWEL GREEN STERILE FF (TOWEL DISPOSABLE) ×4 IMPLANT
UNDERPAD 30X36 HEAVY ABSORB (UNDERPADS AND DIAPERS) ×3 IMPLANT

## 2022-03-13 NOTE — Anesthesia Postprocedure Evaluation (Signed)
Anesthesia Post Note  Patient: Lori Clements  Procedure(s) Performed: DILATATION & CURETTAGE/HYSTEROSCOPY WITH MYOSURE (Uterus) COLPOSCOPY WITH ENDOCERVICAL CURETTAGE (Cervix)     Patient location during evaluation: PACU Anesthesia Type: General Level of consciousness: awake and alert, oriented and patient cooperative Pain management: pain level controlled Vital Signs Assessment: post-procedure vital signs reviewed and stable Respiratory status: spontaneous breathing, nonlabored ventilation and respiratory function stable Cardiovascular status: blood pressure returned to baseline and stable Postop Assessment: no apparent nausea or vomiting Anesthetic complications: no   No notable events documented.  Last Vitals:  Vitals:   03/13/22 1330 03/13/22 1345  BP: 123/82 122/84  Pulse: 70 70  Resp: (!) 22 20  Temp:  36.8 C  SpO2: 94% 94%    Last Pain:  Vitals:   03/13/22 0833  TempSrc:   PainSc: 0-No pain                 Pervis Hocking

## 2022-03-13 NOTE — Transfer of Care (Signed)
Immediate Anesthesia Transfer of Care Note  Patient: Ravynn [place]  Procedure(s) Performed: DILATATION & CURETTAGE/HYSTEROSCOPY WITH MYOSURE (Uterus) COLPOSCOPY WITH ENDOCERVICAL CURETTAGE (Cervix)  Patient Location: PACU  Anesthesia Type:General  Level of Consciousness: awake, alert , patient cooperative and responds to stimulation  Airway & Oxygen Therapy: Patient Spontanous Breathing and Patient connected to face mask oxygen  Post-op Assessment: Report given to RN, Post -op Vital signs reviewed and stable and Patient moving all extremities X 4  Post vital signs: Reviewed and stable  Last Vitals:  Vitals Value Taken Time  BP    Temp    Pulse 75 03/13/22 1312  Resp    SpO2 98 % 03/13/22 1312  Vitals shown include unvalidated device data.  Last Pain:  Vitals:   03/13/22 0833  TempSrc:   PainSc: 0-No pain         Complications: No notable events documented.

## 2022-03-13 NOTE — Op Note (Signed)
Pre Op Dx:  Lori Clements Op Dx:  *** Procedure:  ***   Surgeon:  Dr. Drema Dallas Assistants:  *** Anesthesia:  ***   EBL:  ***cc  IVF:  ***cc UOP:  *** Fluid Deficit:  ***   Drains:  *** Specimen removed:  *** Device(s) implanted: *** Case Type:  *** Findings:  *** Complications: None Indications:  *** Description of each procedure:  After informed consent was obtained the patient was taken to the operating room in the dorsal supine position.  After administration of general anesthesia, the patient was placed in the dorsal lithotomy position and prepped and draped in the usual sterile fashion.  Her bladder was emptied using an in and out catheter.  A pre-operative time-out was completed.  The anterior lip of the cervix was grasped with a single-tooth tenaculum and the cervix was serially dilated to accommodate the hysteroscope.  The hysteroscope was advanced and the findings as above was noted.  A *** curette was used to curettage the endometrium. The single-tooth tenaculum was removed and its sites were made hemostatic.  Adequate hemostasis was noted.  The patient was awakened and extubated and appeared to have tolerated the procedure well.  All counts were correct. Disposition:  ***  Drema Dallas, DO

## 2022-03-13 NOTE — Anesthesia Procedure Notes (Signed)
Procedure Name: Intubation Date/Time: 03/13/2022 12:22 PM  Performed by: Michele Rockers, CRNAPre-anesthesia Checklist: Patient identified, Patient being monitored, Timeout performed, Emergency Drugs available and Suction available Patient Re-evaluated:Patient Re-evaluated prior to induction Oxygen Delivery Method: Circle System Utilized Preoxygenation: Pre-oxygenation with 100% oxygen Induction Type: IV induction Ventilation: Mask ventilation without difficulty Laryngoscope Size: Miller and 2 Grade View: Grade I Tube type: Oral Tube size: 7.0 mm Number of attempts: 1 Airway Equipment and Method: Stylet Placement Confirmation: ETT inserted through vocal cords under direct vision, positive ETCO2 and breath sounds checked- equal and bilateral Secured at: 21 cm Tube secured with: Tape Dental Injury: Teeth and Oropharynx as per pre-operative assessment

## 2022-03-13 NOTE — Interval H&P Note (Signed)
History and Physical Interval Note:  03/13/2022 11:34 AM  Lori Clements  has presented today for surgery, with the diagnosis of Atypical endocervical cells on pap smear.  The various methods of treatment have been discussed with the patient and family. After consideration of risks, benefits and other options for treatment, the patient has consented to  Procedure(s) with comments: [place] (N/A) - rep will be here confirmed on 9/11 CS COLPOSCOPY WITH ENDOCERVICAL CURETTAGE (N/A) as a surgical intervention.  The patient's history has been reviewed, patient examined, no change in status, stable for surgery.  I have reviewed the patient's chart and labs.  Questions were answered to the patient's satisfaction.     Drema Dallas

## 2022-03-14 ENCOUNTER — Encounter (HOSPITAL_COMMUNITY): Payer: Self-pay | Admitting: Obstetrics and Gynecology

## 2022-03-14 LAB — SURGICAL PATHOLOGY

## 2022-03-15 LAB — CYTOLOGY - NON PAP

## 2022-03-28 DIAGNOSIS — D069 Carcinoma in situ of cervix, unspecified: Secondary | ICD-10-CM | POA: Diagnosis not present

## 2022-03-28 NOTE — H&P (Signed)
Lori Clements is an 26 y.o. 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  Patient was unable to tolerate speculum exam in the office and thus opted to have procedures performed under anesthesia. Patient has an upcoming gastric sleeve procedure on 05/04/2022.  Pap smear/colposcopy history: 03/13/2022:  A. 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 CERVIX, 9:00, BIOPSY:  Mild squamous dysplasia and HPV effect (LSIL, CIN-1)  Acute and chronic cervicitis with squamous metaplasia   C. ENDOMETRIUM, CURETTAGE:  Benign inactive endometrium  Changes suggestive of endometrial polyp  Negative for breakdown, hyperplasia and carcinoma   ECC (03/13/2022): FINAL MICROSCOPIC DIAGNOSIS:  - No malignant cells identified  - Benign endocervical cells and endocervical epithelium   SPECIMEN ADEQUACY:  Satisfactory for evaluation   12/17/2021: Atypical Endocervical Cells (AEC-NOS) with high risk HPV (negative 16, 18/45)  02/2020: Normal, per patient  Contraception: Nexplanon (placed 2021)  Patient Active Problem List   Diagnosis Date Noted   Morbid obesity with body mass index (BMI) of 50.0 to 59.9 in adult [facility]) 02/24/2022   Atypical endocervical cells on cervical Papanicolaou smear 02/24/2022   High risk HPV infection 02/24/2022   Genetic testing 02/20/2022    MEDICAL/FAMILY/SOCIAL HX: No LMP recorded.    Past Medical History:  Diagnosis Date   Anxiety    Back pain    Depression    Diabetes mellitus without complication (HCC) 08/21/2021   08/21/21 A1c 6.8%   Elevated liver function tests 08/16/2021   Essential hypertension    GERD (gastroesophageal reflux disease)    IBS (irritable bowel syndrome)    Other fatigue    Shortness of breath on exertion    Sleep apnea    Thyroid disease     Past Surgical History:   Procedure Laterality Date   COLPOSCOPY N/A 03/13/2022   Procedure: COLPOSCOPY WITH ENDOCERVICAL CURETTAGE;  Surgeon: Steva Ready, DO;  Location: [facility] OR;  Service: Gynecology;  Laterality: N/A;   DILATATION & CURETTAGE/HYSTEROSCOPY WITH MYOSURE N/A 03/13/2022   Procedure: DILATATION & CURETTAGE/HYSTEROSCOPY WITH MYOSURE;  Surgeon: Steva Ready, DO;  Location: [facility] OR;  Service: Gynecology;  Laterality: N/A;   WISDOM TOOTH EXTRACTION N/A 2013    Family History  Problem Relation Age of Onset   Obesity Mother    Drug abuse Mother    Schizophrenia Mother    Bipolar disorder Mother    Anxiety disorder Mother    Depression Mother    Cancer Mother        ovarian   Drug abuse Father    Schizophrenia Father    Bipolar disorder Father    Depression Other    Anxiety disorder Other    Bipolar disorder Other    Schizophrenia Other    Drug abuse Other    Obesity Other    Breast cancer Other    Ovarian cancer Half-Sister        dx 51s    Social History:  reports that she has never smoked. She has never used smokeless tobacco. She reports that she does not drink alcohol and does not use drugs.  ALLERGIES/MEDS:  Allergies:  Allergies  Allergen Reactions   Naproxen     hematuria    No medications prior to admission.     Review of Systems  Constitutional: Negative.   HENT: Negative.    Eyes: Negative.   Respiratory:  Negative.    Cardiovascular: Negative.   Gastrointestinal: Negative.   Genitourinary: Negative.   Musculoskeletal: Negative.   Skin: Negative.   Neurological: Negative.   Endo/Heme/Allergies: Negative.   Psychiatric/Behavioral: Negative.      There were no vitals taken for this visit. Gen:  NAD, pleasant and cooperative Cardio:  RRR Pulm:  CTAB, no wheezes/rales/rhonchi Abd:  Soft, non-distended, non-tender throughout, no rebound/guarding Ext:  No bilateral LE edema, no bilateral calf tenderness Pelvic: Poor tolerance of exam, unable to visualize  cervix  No results found for this or any previous visit (from the past 24 hour(s)).  No results found.   ASSESSMENT/PLAN: Lori Clements is a 26 y.o. 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  - Admit to [place] labs (CBC, T&S, COVID screen per protocol) - Diet:  Per anesthesia - IVF:  Per anesthesia - VTE Prophylaxis:  SCDs - Antibiotics: None - Anticipate same-day discharge  Consents: The nature of the CKC is to surgically destroy or remove the abnormal areas, and to send a piece of the tissue to the lab for analysis.  The procedure is diagnostic, and usually therapeutic.  This procedure holds risks of infection, bleeding requiring a blood transfusion, incomplete resolution of the abnormality requiring a future procedure or additional follow-up testing, inability to carry a pregnancy (cervical insufficiency), cervical stenosis (narrowing), blood clot (DVT or PE), and ultimately a risk of death.  Patient understands these risks and agrees to proceed with the above named procedure. Patient was consented for blood products.  The patient is aware that bleeding may result in the need for a blood transfusion which includes risk of transmission of HIV (1:2 million), Hepatitis C (1:2 million), and Hepatitis B (1:200 thousand) and transfusion reaction.  Patient voiced understanding of the above risks as well as understanding of indications for blood transfusion.  Lori Dallas, DO

## 2022-04-02 ENCOUNTER — Encounter (HOSPITAL_COMMUNITY): Payer: Self-pay | Admitting: Obstetrics and Gynecology

## 2022-04-02 ENCOUNTER — Other Ambulatory Visit: Payer: Self-pay

## 2022-04-02 NOTE — Progress Notes (Signed)
Ms Hetty Ely denies chest pain or shortness of breath.  Patient denies having any s/s of Covid in her household, also denies any known exposure to Covid.   Ms Blum's PCP is Almedia Balls, NP.  Ms Hetty Ely has type II diabetes, patient reports that fasting CBGs run 120-150, after she eats it may be 180. I instructed Ms Hetty Ely to not take Metformin in am.  I instructed patient to check CBG after awaking and every 2 hours until arrival  to the hospital.  I Instructed patient if CBG is less than 70 to take 4 Glucose Tablets or 1 tube of Glucose Gel or 1/2 cup of a clear juice. Recheck CBG in 15 minutes if CBG is not over 70 call, pre- op desk at [phone] for further instructions.

## 2022-04-02 NOTE — Addendum Note (Signed)
Addendum  created 04/02/22 0824 by Pervis Hocking, DO   Intraprocedure Event edited, Intraprocedure Staff edited

## 2022-04-03 ENCOUNTER — Ambulatory Visit (HOSPITAL_COMMUNITY)
Admission: RE | Admit: 2022-04-03 | Discharge: 2022-04-03 | Disposition: A | Discharge status: Home / Self Care | Payer: BC Managed Care – PPO | Attending: Obstetrics and Gynecology | Admitting: Obstetrics and Gynecology

## 2022-04-03 ENCOUNTER — Other Ambulatory Visit: Payer: Self-pay

## 2022-04-03 ENCOUNTER — Encounter (HOSPITAL_COMMUNITY): Payer: Self-pay | Admitting: Obstetrics and Gynecology

## 2022-04-03 ENCOUNTER — Encounter (HOSPITAL_COMMUNITY)
Admission: RE | Disposition: A | Discharge status: Home / Self Care | Payer: Self-pay | Source: Home / Self Care | Attending: Obstetrics and Gynecology

## 2022-04-03 ENCOUNTER — Ambulatory Visit (HOSPITAL_COMMUNITY): Payer: BC Managed Care – PPO | Admitting: Anesthesiology

## 2022-04-03 DIAGNOSIS — D069 Carcinoma in situ of cervix, unspecified: Secondary | ICD-10-CM | POA: Diagnosis not present

## 2022-04-03 DIAGNOSIS — Z01818 Encounter for other preprocedural examination: Secondary | ICD-10-CM

## 2022-04-03 DIAGNOSIS — N870 Mild cervical dysplasia: Secondary | ICD-10-CM | POA: Diagnosis not present

## 2022-04-03 DIAGNOSIS — Z9989 Dependence on other enabling machines and devices: Secondary | ICD-10-CM | POA: Diagnosis not present

## 2022-04-03 DIAGNOSIS — B977 Papillomavirus as the cause of diseases classified elsewhere: Secondary | ICD-10-CM

## 2022-04-03 DIAGNOSIS — I10 Essential (primary) hypertension: Secondary | ICD-10-CM | POA: Diagnosis not present

## 2022-04-03 DIAGNOSIS — G4733 Obstructive sleep apnea (adult) (pediatric): Secondary | ICD-10-CM | POA: Diagnosis not present

## 2022-04-03 HISTORY — PX: CERVICAL CONIZATION W/BX: SHX1330

## 2022-04-03 LAB — GLUCOSE, CAPILLARY
Glucose-Capillary: 95 mg/dL (ref 70–99)
Glucose-Capillary: 103 mg/dL — ABNORMAL HIGH (ref 70–99)

## 2022-04-03 LAB — TYPE AND SCREEN
ABO/RH(D): O POS
Antibody Screen: NEGATIVE

## 2022-04-03 LAB — POCT PREGNANCY, URINE: Preg Test, Ur: NEGATIVE

## 2022-04-03 SURGERY — CONE BIOPSY, CERVIX
Anesthesia: General | Site: Vagina

## 2022-04-03 MED ORDER — IBUPROFEN 800 MG PO TABS: 800.0000 mg | ORAL_TABLET | Freq: Three times a day (TID) | ORAL | 0 refills | Status: DC | PRN

## 2022-04-03 MED ORDER — MEPERIDINE HCL 25 MG/ML IJ SOLN: 6.2500 mg | INTRAMUSCULAR | Status: DC | PRN

## 2022-04-03 MED ORDER — PROPOFOL 10 MG/ML IV BOLUS
INTRAVENOUS | Status: AC
  Filled 2022-04-03: qty 20

## 2022-04-03 MED ORDER — DEXAMETHASONE SODIUM PHOSPHATE 10 MG/ML IJ SOLN
INTRAMUSCULAR | Status: DC | PRN
  Administered 2022-04-03: 10 mg via INTRAVENOUS

## 2022-04-03 MED ORDER — LIDOCAINE 2% (20 MG/ML) 5 ML SYRINGE
INTRAMUSCULAR | Status: DC | PRN
  Administered 2022-04-03: 60 mg via INTRAVENOUS

## 2022-04-03 MED ORDER — INSULIN ASPART 100 UNIT/ML IJ SOLN: 0.0000 [IU] | INTRAMUSCULAR | Status: DC | PRN

## 2022-04-03 MED ORDER — OXYCODONE HCL 5 MG PO TABS: 5.0000 mg | ORAL_TABLET | Freq: Once | ORAL | Status: DC | PRN

## 2022-04-03 MED ORDER — MIDAZOLAM HCL 2 MG/2ML IJ SOLN
INTRAMUSCULAR | Status: DC | PRN
  Administered 2022-04-03: 2 mg via INTRAVENOUS

## 2022-04-03 MED ORDER — LIDOCAINE HCL (PF) 1 % IJ SOLN
INTRAMUSCULAR | Status: AC
  Filled 2022-04-03: qty 30

## 2022-04-03 MED ORDER — CHLORHEXIDINE GLUCONATE 0.12 % MT SOLN: 15.0000 mL | Freq: Once | OROMUCOSAL | Status: AC

## 2022-04-03 MED ORDER — CHLORHEXIDINE GLUCONATE 0.12 % MT SOLN
OROMUCOSAL | Status: AC
  Administered 2022-04-03: 15 mL via OROMUCOSAL
  Filled 2022-04-03: qty 15

## 2022-04-03 MED ORDER — ONDANSETRON HCL 4 MG/2ML IJ SOLN
INTRAMUSCULAR | Status: DC | PRN
  Administered 2022-04-03: 4 mg via INTRAVENOUS

## 2022-04-03 MED ORDER — FENTANYL CITRATE (PF) 250 MCG/5ML IJ SOLN
INTRAMUSCULAR | Status: AC
  Filled 2022-04-03: qty 5

## 2022-04-03 MED ORDER — HEMOSTATIC AGENTS (NO CHARGE) OPTIME
TOPICAL | Status: DC | PRN
  Administered 2022-04-03: 1 via TOPICAL

## 2022-04-03 MED ORDER — OXYCODONE HCL 5 MG/5ML PO SOLN: 5.0000 mg | Freq: Once | ORAL | Status: DC | PRN

## 2022-04-03 MED ORDER — LACTATED RINGERS IV SOLN: INTRAVENOUS | Status: DC

## 2022-04-03 MED ORDER — PROPOFOL 10 MG/ML IV BOLUS
INTRAVENOUS | Status: DC | PRN
  Administered 2022-04-03: 300 mg via INTRAVENOUS
  Administered 2022-04-03: 70 mg via INTRAVENOUS
  Administered 2022-04-03: 100 mg via INTRAVENOUS
  Administered 2022-04-03: 30 mg via INTRAVENOUS

## 2022-04-03 MED ORDER — FENTANYL CITRATE (PF) 250 MCG/5ML IJ SOLN
INTRAMUSCULAR | Status: DC | PRN
  Administered 2022-04-03 (×4): 50 ug via INTRAVENOUS

## 2022-04-03 MED ORDER — FERRIC SUBSULFATE (BULK) SOLN
Status: DC | PRN
  Administered 2022-04-03: 1

## 2022-04-03 MED ORDER — FERRIC SUBSULFATE 259 MG/GM EX SOLN
CUTANEOUS | Status: AC
  Filled 2022-04-03: qty 8

## 2022-04-03 MED ORDER — IODINE STRONG (LUGOLS) 5 % PO SOLN
ORAL | Status: AC
  Filled 2022-04-03: qty 1

## 2022-04-03 MED ORDER — ACETAMINOPHEN 500 MG PO TABS
1000.0000 mg | ORAL_TABLET | Freq: Once | ORAL | Status: AC
  Administered 2022-04-03: 1000 mg via ORAL
  Filled 2022-04-03: qty 2

## 2022-04-03 MED ORDER — SCOPOLAMINE 1 MG/3DAYS TD PT72
1.0000 | MEDICATED_PATCH | TRANSDERMAL | Status: DC
  Administered 2022-04-03: 1.5 mg via TRANSDERMAL
  Filled 2022-04-03: qty 1

## 2022-04-03 MED ORDER — HYDROMORPHONE HCL 1 MG/ML IJ SOLN: 0.2500 mg | INTRAMUSCULAR | Status: DC | PRN

## 2022-04-03 MED ORDER — ACETIC ACID 5 % SOLN
Status: AC
  Filled 2022-04-03: qty 500

## 2022-04-03 MED ORDER — LIDOCAINE HCL (PF) 1 % IJ SOLN
INTRAMUSCULAR | Status: DC | PRN
  Administered 2022-04-03: 30 mL

## 2022-04-03 MED ORDER — MIDAZOLAM HCL 2 MG/2ML IJ SOLN
INTRAMUSCULAR | Status: AC
  Filled 2022-04-03: qty 2

## 2022-04-03 MED ORDER — ONDANSETRON HCL 4 MG/2ML IJ SOLN: 4.0000 mg | Freq: Once | INTRAMUSCULAR | Status: DC | PRN

## 2022-04-03 MED ORDER — AMISULPRIDE (ANTIEMETIC) 5 MG/2ML IV SOLN: 10.0000 mg | Freq: Once | INTRAVENOUS | Status: DC | PRN

## 2022-04-03 MED ORDER — ORAL CARE MOUTH RINSE: 15.0000 mL | Freq: Once | OROMUCOSAL | Status: AC

## 2022-04-03 SURGICAL SUPPLY — 32 items
BLADE SURG 11 STRL SS (BLADE) ×2 IMPLANT
CATH ROBINSON RED A/P 16FR (CATHETERS) ×2 IMPLANT
CNTNR URN SCR LID CUP LEK RST (MISCELLANEOUS) IMPLANT
CONT SPEC 4OZ STRL OR WHT (MISCELLANEOUS) ×1
ELECT BALL LEEP 5MM RED (ELECTRODE) ×2 IMPLANT
ELECT BLADE 6.5 EXT (BLADE) IMPLANT
ELECT REM PT RETURN 9FT ADLT (ELECTROSURGICAL) ×1
ELECTRODE REM PT RTRN 9FT ADLT (ELECTROSURGICAL) ×1 IMPLANT
GLOVE BIO SURGEON STRL SZ 6.5 (GLOVE) ×2 IMPLANT
GLOVE BIOGEL PI IND STRL 6.5 (GLOVE) ×2 IMPLANT
GLOVE BIOGEL PI IND STRL 7.0 (GLOVE) ×1 IMPLANT
GLOVE SURG SS PI 7.0 STRL IVOR (GLOVE) IMPLANT
GLOVE SURG SS PI 7.5 STRL IVOR (GLOVE) IMPLANT
GOWN STRL REUS W/ TWL LRG LVL3 (GOWN DISPOSABLE) ×4 IMPLANT
GOWN STRL REUS W/TWL LRG LVL3 (GOWN DISPOSABLE) ×2
HEMOSTAT SURGICEL 2X14 (HEMOSTASIS) IMPLANT
HEMOSTAT SURGICEL 4X8 (HEMOSTASIS) IMPLANT
NS IRRIG 1000ML POUR BTL (IV SOLUTION) ×2 IMPLANT
PACK VAGINAL MINOR WOMEN LF (CUSTOM PROCEDURE TRAY) ×1 IMPLANT
PAD OB MATERNITY 4.3X12.25 (PERSONAL CARE ITEMS) ×2 IMPLANT
PENCIL BUTTON HOLSTER BLD 10FT (ELECTRODE) ×1 IMPLANT
PENCIL SMOKE EVACUATOR (MISCELLANEOUS) IMPLANT
SCOPETTES 8  STERILE (MISCELLANEOUS) ×1
SCOPETTES 8 STERILE (MISCELLANEOUS) ×2 IMPLANT
SOAP 2 % CHG 4 OZ (WOUND CARE) IMPLANT
SPONGE SURGIFOAM ABS GEL 12-7 (HEMOSTASIS) IMPLANT
SUT SILK 2 0 SH (SUTURE) ×2 IMPLANT
SUT VIC AB 0 CT1 27 (SUTURE) ×3
SUT VIC AB 0 CT1 27XBRD ANBCTR (SUTURE) ×4 IMPLANT
TOWEL GREEN STERILE FF (TOWEL DISPOSABLE) ×4 IMPLANT
TUBE CONNECTING 12X1/4 (SUCTIONS) ×2 IMPLANT
YANKAUER SUCT BULB TIP NO VENT (SUCTIONS) ×1 IMPLANT

## 2022-04-03 NOTE — Transfer of Care (Signed)
Immediate Anesthesia Transfer of Care Note  Patient: Lori Clements  Procedure(s) Performed: CONIZATION CERVIX WITH BIOPSY (Vagina )  Patient Location: PACU  Anesthesia Type:General  Level of Consciousness: awake, alert , and oriented  Airway & Oxygen Therapy: Patient Spontanous Breathing  Post-op Assessment: Report given to RN  Post vital signs: Reviewed and stable  Last Vitals:  Vitals Value Taken Time  BP 143/80 1714  Temp    Pulse 85 04/03/22 1714  Resp    SpO2 96 % 04/03/22 1714  Vitals shown include unvalidated device data.  Last Pain:  Vitals:   04/03/22 1351  TempSrc:   PainSc: 0-No pain         Complications: No notable events documented.

## 2022-04-03 NOTE — Anesthesia Preprocedure Evaluation (Addendum)
Anesthesia Evaluation  Patient identified by MRN, date of birth, ID band  Reviewed: Allergy & Precautions, NPO status , Patient's Chart, lab work & pertinent test results  Airway Mallampati: III  TM Distance: >3 FB Neck ROM: Full    Dental  (+) Teeth Intact, Dental Advisory Given   Pulmonary sleep apnea and Continuous Positive Airway Pressure Ventilation ,   Pulmonary exam normal  breath sounds clear to auscultation       Cardiovascular hypertension, Pt. on medications Normal cardiovascular exam Rhythm:Regular Rate:Normal     Neuro/Psych  PSYCHIATRIC DISORDERS Anxiety Depression    negative neurological ROS     GI/Hepatic Neg liver ROS,GERD  Medicated and Controlled,,  Endo/Other  diabetes, Well Controlled, Type 2, Oral Hypoglycemic Agents  Morbid obesityBMI 56 a1c 6.8  Renal/GU negative Renal ROS  negative genitourinary   Musculoskeletal negative musculoskeletal ROS (+)    Abdominal (+) + obese,   Peds  Hematology negative hematology ROS (+)   Anesthesia Other Findings   Reproductive/Obstetrics Urine preg neg                            Anesthesia Physical Anesthesia Plan  ASA: 3  Anesthesia Plan: General   Post-op Pain Management: Tylenol PO (pre-op)* and Toradol IV (intra-op)*   Induction: Intravenous  PONV Risk Score and Plan: 4 or greater and Ondansetron, Dexamethasone, Midazolam and Treatment may vary due to age or medical condition  Airway Management Planned: LMA  Additional Equipment: None  Intra-op Plan:   Post-operative Plan: Extubation in OR  Informed Consent: I have reviewed the patients History and Physical, chart, labs and discussed the procedure including the risks, benefits and alternatives for the proposed anesthesia with the patient or authorized representative who has indicated his/her understanding and acceptance.     Dental advisory given  Plan Discussed  with: CRNA  Anesthesia Plan Comments:         Anesthesia Quick Evaluation

## 2022-04-03 NOTE — Interval H&P Note (Signed)
History and Physical Interval Note:  04/03/2022 3:15 PM  Lori Clements  has presented today for surgery, with the diagnosis of Cervical Intraepithelial Neoplasia III.  The various methods of treatment have been discussed with the patient and family. After consideration of risks, benefits and other options for treatment, the patient has consented to  Procedure(s): CONIZATION CERVIX WITH BIOPSY (N/A) as a surgical intervention.  The patient's history has been reviewed, patient examined, no change in status, stable for surgery.  I have reviewed the patient's chart and labs.  Questions were answered to the patient's satisfaction.     Drema Dallas

## 2022-04-03 NOTE — Op Note (Signed)
Pre Op Dx:   High grade cervical dysplasia (CIN III)  Post Op Dx:   Same as as pre-operative diagnoses  Procedure:  Cold knife conization of the cervix  Surgeon:  Dr. Delora Fuel Assistants:  None Anesthesia:  LMA  EBL:  250cc  IVF:  1000cc UOP:  50cc via in-and-out catheter  Drains:  None Specimen removed:  Cervical cone biopsy - sent to pathology Device(s) implanted:  None Case Type:  Clean-contaminated Findings: Normal-appearing cervix which is very posterior. Complications: None Indications:  26 y.o.yo patient with abnormal Pap, cervical biopsy showing CIN III and benign endocervical curettings.  Description of Procedure:  After informed consent was obtained the patient was brought to the operating room.  She was placed in dorsal supine position and anesthesia was administered.  She was placed in dorsal lithotomy position and prepped and draped in the usual sterile fashion.  Her bladder was emptied.  A preoperative timeout was called. A paracervical block and cervical block was administered using 1% Lidocaine without epinephrine. Two 0-Vicryl sutures were placed at 3 and 9 o'clock on the cervix for traction and hemostasis.  A circumferential cervical incision was initiated and traction sutures were placed in the specimen, tagging the specimen at 12 o'clock.  The cone biopsy was completed sharply with scissors and passed off the field. Due to oozing/bleeding and habitus, visualization was quite difficult. The bed of the cone biopsy was made hemostatic with electrosurgery. Monsel's used on the cone  bed for additional hemostasis.  A piece of Surgicel was then tied in place over the cone bed. Hemostasis noted.  She was returned to dorsal supine position, awakened and extubated having appeared to tolerate the procedure well.  All counts were correct.  She was transferred to PACU in good condition.    Disposition: PACU  Drema Dallas, DO

## 2022-04-03 NOTE — Anesthesia Procedure Notes (Signed)
Procedure Name: LMA Insertion Date/Time: 04/03/2022 3:32 PM  Performed by: Griffin Dakin, CRNAPre-anesthesia Checklist: Patient identified, Emergency Drugs available, Suction available, Timeout performed and Patient being monitored Patient Re-evaluated:Patient Re-evaluated prior to induction Oxygen Delivery Method: Circle system utilized Preoxygenation: Pre-oxygenation with 100% oxygen Induction Type: IV induction LMA: LMA inserted LMA Size: 4.0 Number of attempts: 1 Placement Confirmation: positive ETCO2 and breath sounds checked- equal and bilateral Tube secured with: Tape Dental Injury: Teeth and Oropharynx as per pre-operative assessment

## 2022-04-04 ENCOUNTER — Encounter (HOSPITAL_COMMUNITY): Payer: Self-pay | Admitting: Obstetrics and Gynecology

## 2022-04-04 NOTE — Anesthesia Postprocedure Evaluation (Signed)
Anesthesia Post Note  Patient: Lori Clements  Procedure(s) Performed: CONIZATION CERVIX WITH BIOPSY (Vagina )     Patient location during evaluation: PACU Anesthesia Type: General Level of consciousness: awake and alert Pain management: pain level controlled Vital Signs Assessment: post-procedure vital signs reviewed and stable Respiratory status: spontaneous breathing, nonlabored ventilation and respiratory function stable Cardiovascular status: stable and blood pressure returned to baseline Anesthetic complications: no   No notable events documented.  Last Vitals:  Vitals:   04/03/22 1730 04/03/22 1745  BP: 139/89 138/86  Pulse: 79 76  Resp: 19 19  Temp:  36.7 C  SpO2: 95% 95%    Last Pain:  Vitals:   04/03/22 1745  TempSrc:   PainSc: 0-No pain                 Audry Pili

## 2022-04-09 ENCOUNTER — Ambulatory Visit: Payer: Self-pay | Admitting: Surgery

## 2022-04-09 LAB — SURGICAL PATHOLOGY

## 2022-04-15 ENCOUNTER — Encounter: Payer: Self-pay | Admitting: Skilled Nursing Facility1

## 2022-04-15 ENCOUNTER — Encounter: Payer: BC Managed Care – PPO | Attending: Family Medicine | Admitting: Skilled Nursing Facility1

## 2022-04-15 DIAGNOSIS — Z713 Dietary counseling and surveillance: Secondary | ICD-10-CM | POA: Insufficient documentation

## 2022-04-15 DIAGNOSIS — E11649 Type 2 diabetes mellitus with hypoglycemia without coma: Secondary | ICD-10-CM | POA: Diagnosis not present

## 2022-04-15 NOTE — Progress Notes (Signed)
Pre-Operative Nutrition Class:    Patient was seen on 04/15/2022 for Pre-Operative Bariatric Surgery Education at [facility].    Surgery date: 05/06/2022 Surgery type: sleeve Start weight at [facility]: 340.8 Weight today: 349  Samples given per MNT protocol. Patient educated on appropriate usage:  Bariatric Advantage Multivitamin Lot # [id number] Exp: 08/24   Bariatric Advantage Calcium  Lot # [id number] Exp: 05/14/2022   Protein Shake Ensure Max Choc Lot # [id number] Exp: 24 Jun 2022   Protein Shake Ensure Max Lucianne Lei Lot # [id number] Exp: 24 May 2022  The following the learning objectives were met by the patient during this course: Identify Pre-Op Dietary Goals and will begin 2 weeks pre-operatively Identify appropriate sources of fluids and proteins  State protein recommendations and appropriate sources pre and post-operatively Identify Post-Operative Dietary Goals and will follow for 2 weeks post-operatively Identify appropriate multivitamin and calcium sources Describe the need for physical activity post-operatively and will follow MD recommendations State when to call healthcare provider regarding medication questions or post-operative complications When having a diagnosis of diabetes understanding hypoglycemia symptoms and the inclusion of 1 complex carbohydrate per meal  Handouts given during class include: Pre-Op Bariatric Surgery Diet Handout Protein Shake Handout Post-Op Bariatric Surgery Nutrition Handout BELT Program Information Flyer Support Group Information Flyer [facility] Bariatric Supplements Price List  Follow-Up Plan: Patient will follow-up at [facility] 2 weeks post operatively for diet advancement per MD.

## 2022-04-29 ENCOUNTER — Encounter (HOSPITAL_COMMUNITY): Admission: RE | Admit: 2022-04-29 | Payer: BC Managed Care – PPO | Source: Ambulatory Visit

## 2022-05-03 ENCOUNTER — Ambulatory Visit: Payer: Self-pay | Admitting: Surgery

## 2022-05-06 ENCOUNTER — Inpatient Hospital Stay: Admit: 2022-05-06 | Payer: BC Managed Care – PPO | Admitting: Surgery

## 2022-05-06 SURGERY — GASTRECTOMY, SLEEVE, LAPAROSCOPIC
Anesthesia: General

## 2022-05-10 NOTE — Patient Instructions (Signed)
SURGICAL WAITING ROOM VISITATION Patients having surgery or a procedure may have no more than 2 support people in the waiting area - these visitors may rotate.   Children under the age of 63 must have an adult with them who is not the patient. If the patient needs to stay at the hospital during part of their recovery, the visitor guidelines for inpatient rooms apply. Pre-op nurse will coordinate an appropriate time for 1 support person to accompany patient in pre-op.  This support person may not rotate.    Please refer to the [facility] website for the visitor guidelines for Inpatients (after your surgery is over and you are in a regular room).    Your procedure is scheduled on: 05/21/22   Report to [facility] Main Entrance    Report to admitting at 5:15 AM   Call this number if you have problems the morning of surgery [phone]   MORNING OF SURGERY DRINK:   DRINK 1 G2 drink BEFORE YOU LEAVE HOME, DRINK ALL OF THE  G2 DRINK AT ONE TIME.   NO SOLID FOOD AFTER 600 PM THE NIGHT BEFORE YOUR SURGERY. YOU MAY DRINK CLEAR FLUIDS. THE G2 DRINK YOU DRINK BEFORE YOU LEAVE HOME WILL BE THE LAST FLUIDS YOU DRINK BEFORE SURGERY.  PAIN IS EXPECTED AFTER SURGERY AND WILL NOT BE COMPLETELY ELIMINATED. AMBULATION AND TYLENOL WILL HELP REDUCE INCISIONAL AND GAS PAIN. MOVEMENT IS KEY!  YOU ARE EXPECTED TO BE OUT OF BED WITHIN 4 HOURS OF ADMISSION TO YOUR PATIENT ROOM.  SITTING IN THE RECLINER THROUGHOUT THE DAY IS IMPORTANT FOR DRINKING FLUIDS AND MOVING GAS THROUGHOUT THE GI TRACT.  COMPRESSION STOCKINGS SHOULD BE WORN [place] UNLESS YOU ARE WALKING.   INCENTIVE SPIROMETER SHOULD BE USED EVERY HOUR WHILE AWAKE TO DECREASE POST-OPERATIVE COMPLICATIONS SUCH AS PNEUMONIA.  WHEN DISCHARGED HOME, IT IS IMPORTANT TO CONTINUE TO WALK EVERY HOUR AND USE THE INCENTIVE SPIROMETER EVERY HOUR.    You may have the following liquids until 4:30 AM DAY OF SURGERY  Water Non-Citrus  Juices (without pulp, NO RED) Carbonated Beverages Black Coffee (NO MILK/CREAM OR CREAMERS, sugar ok)  Clear Tea (NO MILK/CREAM OR CREAMERS, sugar ok) regular and decaf                             Plain Jell-O (NO RED)                                           Fruit ices (not with fruit pulp, NO RED)                                     Popsicles (NO RED)                                                               Sports drinks like Gatorade (NO RED     The day of surgery:  Drink ONE (1) Pre-Surgery G2 at 4:30 AM the morning of surgery. Drink in one sitting. Do not sip.  This  drink was given to you during your hospital  pre-op appointment visit. Nothing else to drink after completing the  Pre-Surgery G2.          If you have questions, please contact your surgeon's office.   FOLLOW BOWEL PREP AND ANY ADDITIONAL PRE OP INSTRUCTIONS YOU RECEIVED FROM YOUR SURGEON'S OFFICE!!!     Oral Hygiene is also important to reduce your risk of infection.                                    Remember - BRUSH YOUR TEETH THE MORNING OF SURGERY WITH YOUR REGULAR TOOTHPASTE   Take these medicines the morning of surgery with A SIP OF WATER: Sertraline   DO NOT TAKE ANY ORAL DIABETIC MEDICATIONS DAY OF YOUR SURGERY  How to Manage Your Diabetes Before and After Surgery  Why is it important to control my blood sugar before and after surgery? Improving blood sugar levels before and after surgery helps healing and can limit problems. A way of improving blood sugar control is eating a healthy diet by:  Eating less sugar and carbohydrates  Increasing activity/exercise  Talking with your doctor about reaching your blood sugar goals High blood sugars (greater than 180 mg/dL) can raise your risk of infections and slow your recovery, so you will need to focus on controlling your diabetes during the weeks before surgery. Make sure that the doctor who takes care of your diabetes knows about your planned  surgery including the date and location.  How do I manage my blood sugar before surgery? Check your blood sugar at least 4 times a day, starting 2 days before surgery, to make sure that the level is not too high or low. Check your blood sugar the morning of your surgery when you wake up and every 2 hours until you get to the Short Stay unit. If your blood sugar is less than 70 mg/dL, you will need to treat for low blood sugar: Do not take insulin. Treat a low blood sugar (less than 70 mg/dL) with  cup of clear juice (cranberry or apple), 4 glucose tablets, OR glucose gel. Recheck blood sugar in 15 minutes after treatment (to make sure it is greater than 70 mg/dL). If your blood sugar is not greater than 70 mg/dL on recheck, call [phone] for further instructions. Report your blood sugar to the short stay nurse when you get to Short Stay.  If you are admitted to the hospital after surgery: Your blood sugar will be checked by the staff and you will probably be given insulin after surgery (instead of oral diabetes medicines) to make sure you have good blood sugar levels. The goal for blood sugar control after surgery is 80-180 mg/dL.   WHAT DO I DO ABOUT MY DIABETES MEDICATION?  Do not take oral diabetes medicines (pills) the morning of surgery.  THE DAY BEFORE SURGERY, take Metformin as prescribed.      THE MORNING OF SURGERY, do not take Metformin.   Reviewed and Endorsed by [facility] Patient Education Committee, August 2015  Bring CPAP mask and tubing day of surgery.                              You may not have any metal on your body including hair pins, jewelry, and body piercing  Do not wear make-up, lotions, powders, perfumes, or deodorant  Do not wear nail polish including gel and S&S, artificial/acrylic nails, or any other type of covering on natural nails including finger and toenails. If you have artificial nails, gel coating, etc. that needs to be removed  by a nail salon please have this removed prior to surgery or surgery may need to be canceled/ delayed if the surgeon/ anesthesia feels like they are unable to be safely monitored.   Do not shave  48 hours prior to surgery.    Do not bring valuables to the hospital. [place].   Contacts, dentures or bridgework may not be worn into surgery.   Bring small overnight bag day of surgery.   DO NOT [place]. PHARMACY WILL DISPENSE MEDICATIONS LISTED ON YOUR MEDICATION LIST TO YOU DURING YOUR ADMISSION [place]!              Please read over the following fact sheets you were given: IF [place] [phone] Schneiders   If you received a COVID test during your pre-op visit  it is requested that you wear a mask when out in public, stay away from anyone that may not be feeling well and notify your surgeon if you develop symptoms. If you test positive for Covid or have been in contact with anyone that has tested positive in the last 10 days please notify you surgeon.    [place] - Preparing for Surgery Before surgery, you can play an important role.  Because skin is not sterile, your skin needs to be as free of germs as possible.  You can reduce the number of germs on your skin by washing with CHG (chlorahexidine gluconate) soap before surgery.  CHG is an antiseptic cleaner which kills germs and bonds with the skin to continue killing germs even after washing. Please DO NOT use if you have an allergy to CHG or antibacterial soaps.  If your skin becomes reddened/irritated stop using the CHG and inform your nurse when you arrive at Short Stay. Do not shave (including legs and underarms) for at least 48 hours prior to the first CHG shower.  You may shave your face/neck.  Please follow these instructions carefully:  1.  Shower with CHG Soap the night before surgery  and the  morning of surgery.  2.  If you choose to wash your hair, wash your hair first as usual with your normal  shampoo.  3.  After you shampoo, rinse your hair and body thoroughly to remove the shampoo.                             4.  Use CHG as you would any other liquid soap.  You can apply chg directly to the skin and wash.  Gently with a scrungie or clean washcloth.  5.  Apply the CHG Soap to your body ONLY FROM THE NECK DOWN.   Do   not use on face/ open                           Wound or open sores. Avoid contact with eyes, ears mouth and   genitals (private parts).  Wash face,  Genitals (private parts) with your normal soap.             6.  Wash thoroughly, paying special attention to the area where your    surgery  will be performed.  7.  Thoroughly rinse your body with warm water from the neck down.  8.  DO NOT shower/wash with your normal soap after using and rinsing off the CHG Soap.                9.  Pat yourself dry with a clean towel.            10.  Wear clean pajamas.            11.  Place clean sheets on your bed the night of your first shower and do not  sleep with pets. Day of Surgery : Do not apply any lotions/deodorants the morning of surgery.  Please wear clean clothes to the hospital/surgery center.  FAILURE TO FOLLOW THESE INSTRUCTIONS MAY RESULT IN THE CANCELLATION OF YOUR SURGERY  PATIENT SIGNATURE_________________________________  NURSE SIGNATURE__________________________________  ________________________________________________________________________  Adam Phenix  An incentive spirometer is a tool that can help keep your lungs clear and active. This tool measures how well you are filling your lungs with each breath. Taking long deep breaths may help reverse or decrease the chance of developing breathing (pulmonary) problems (especially infection) following: A long period of time when you are unable to move or be  active. BEFORE THE PROCEDURE  If the spirometer includes an indicator to show your best effort, your nurse or respiratory therapist will set it to a desired goal. If possible, sit up straight or lean slightly forward. Try not to slouch. Hold the incentive spirometer in an upright position. INSTRUCTIONS FOR USE  Sit on the edge of your bed if possible, or sit up as far as you can in bed or on a chair. Hold the incentive spirometer in an upright position. Breathe out normally. Place the mouthpiece in your mouth and seal your lips tightly around it. Breathe in slowly and as deeply as possible, raising the piston or the ball toward the top of the column. Hold your breath for 3-5 seconds or for as long as possible. Allow the piston or ball to fall to the bottom of the column. Remove the mouthpiece from your mouth and breathe out normally. Rest for a few seconds and repeat Steps 1 through 7 at least 10 times every 1-2 hours when you are awake. Take your time and take a few normal breaths between deep breaths. The spirometer may include an indicator to show your best effort. Use the indicator as a goal to work toward during each repetition. After each set of 10 deep breaths, practice coughing to be sure your lungs are clear. If you have an incision (the cut made at the time of surgery), support your incision when coughing by placing a pillow or rolled up towels firmly against it. Once you are able to get out of bed, walk around indoors and cough well. You may stop using the incentive spirometer when instructed by your caregiver.  RISKS AND COMPLICATIONS Take your time so you do not get dizzy or light-headed. If you are in pain, you may need to take or ask for pain medication before doing incentive spirometry. It is harder to take a deep breath if you are having pain. AFTER USE Rest and breathe slowly and easily. It can be helpful to keep  track of a log of your progress. Your caregiver can provide you  with a simple table to help with this. If you are using the spirometer at home, follow these instructions: SEEK MEDICAL CARE IF:  You are having difficultly using the spirometer. You have trouble using the spirometer as often as instructed. Your pain medication is not giving enough relief while using the spirometer. You develop fever of 100.5 F (38.1 C) or higher. SEEK IMMEDIATE MEDICAL CARE IF:  You cough up bloody sputum that had not been present before. You develop fever of 102 F (38.9 C) or greater. You develop worsening pain at or near the incision site. MAKE SURE YOU:  Understand these instructions. Will watch your condition. Will get help right away if you are not doing well or get worse. Document Released: 10/21/2006 Document Revised: 09/02/2011 Document Reviewed: 12/22/2006 Exit[employer] Patient Information 2014 ExitCare, [place].   ________________________________________________________________________ WHAT IS A BLOOD TRANSFUSION? Blood Transfusion Information  A transfusion is the replacement of blood or some of its parts. Blood is made up of multiple cells which provide different functions. Red blood cells carry oxygen and are used for blood loss replacement. White blood cells fight against infection. Platelets control bleeding. Plasma helps clot blood. Other blood products are available for specialized needs, such as hemophilia or other clotting disorders. BEFORE THE TRANSFUSION  Who gives blood for transfusions?  Healthy volunteers who are fully evaluated to make sure their blood is safe. This is blood bank blood. Transfusion therapy is the safest it has ever been in the practice of medicine. Before blood is taken from a donor, a complete history is taken to make sure that person has no history of diseases nor engages in risky social behavior (examples are intravenous drug use or sexual activity with multiple partners). The donor's travel history is screened to minimize  risk of transmitting infections, such as malaria. The donated blood is tested for signs of infectious diseases, such as HIV and hepatitis. The blood is then tested to be sure it is compatible with you in order to minimize the chance of a transfusion reaction. If you or a relative donates blood, this is often done in anticipation of surgery and is not appropriate for emergency situations. It takes many days to process the donated blood. RISKS AND COMPLICATIONS Although transfusion therapy is very safe and saves many lives, the main dangers of transfusion include:  Getting an infectious disease. Developing a transfusion reaction. This is an allergic reaction to something in the blood you were given. Every precaution is taken to prevent this. The decision to have a blood transfusion has been considered carefully by your caregiver before blood is given. Blood is not given unless the benefits outweigh the risks. AFTER THE TRANSFUSION Right after receiving a blood transfusion, you will usually feel much better and more energetic. This is especially true if your red blood cells have gotten low (anemic). The transfusion raises the level of the red blood cells which carry oxygen, and this usually causes an energy increase. The nurse administering the transfusion will monitor you carefully for complications. HOME CARE INSTRUCTIONS  No special instructions are needed after a transfusion. You may find your energy is better. Speak with your caregiver about any limitations on activity for underlying diseases you may have. SEEK MEDICAL CARE IF:  Your condition is not improving after your transfusion. You develop redness or irritation at the intravenous (IV) site. SEEK IMMEDIATE MEDICAL CARE IF:  Any of the following symptoms  occur over the next 12 hours: Shaking chills. You have a temperature by mouth above 102 F (38.9 C), not controlled by medicine. Chest, back, or muscle pain. People around you feel you are  not acting correctly or are confused. Shortness of breath or difficulty breathing. Dizziness and fainting. You get a rash or develop hives. You have a decrease in urine output. Your urine turns a dark color or changes to pink, red, or brown. Any of the following symptoms occur over the next 10 days: You have a temperature by mouth above 102 F (38.9 C), not controlled by medicine. Shortness of breath. Weakness after normal activity. The white part of the eye turns yellow (jaundice). You have a decrease in the amount of urine or are urinating less often. Your urine turns a dark color or changes to pink, red, or brown. Document Released: 06/07/2000 Document Revised: 09/02/2011 Document Reviewed: 01/25/2008 [facility] Patient Information 2014 [place], [place].  _______________________________________________________________________

## 2022-05-10 NOTE — Progress Notes (Signed)
COVID Vaccine Completed:  Date of COVID positive in last 90 days:  PCP - Carilyn Goodpasture, NP Cardiologist -   Chest x-ray - 09/28/21 Epic EKG - 08/21/21 Epic Stress Test -  ECHO -  Cardiac Cath -  Pacemaker/ICD device last checked: Spinal Cord Stimulator:  Bowel Prep -   Sleep Study -  CPAP -   Fasting Blood Sugar -  Checks Blood Sugar _____ times a day  Last dose of GLP1 agonist-  N/A GLP1 instructions:  N/A   Last dose of SGLT-2 inhibitors-  N/A SGLT-2 instructions: N/A   Blood Thinner Instructions: Aspirin Instructions: Last Dose:  Activity level:  Can go up a flight of stairs and perform activities of daily living without stopping and without symptoms of chest pain or shortness of breath.  Able to exercise without symptoms  Unable to go up a flight of stairs without symptoms of     Anesthesia review:   Patient denies shortness of breath, fever, cough and chest pain at PAT appointment  Patient verbalized understanding of instructions that were given to them at the PAT appointment. Patient was also instructed that they will need to review over the PAT instructions again at home before surgery.

## 2022-05-13 ENCOUNTER — Encounter (HOSPITAL_COMMUNITY): Payer: Self-pay

## 2022-05-13 ENCOUNTER — Encounter (HOSPITAL_COMMUNITY)
Admission: RE | Admit: 2022-05-13 | Discharge: 2022-05-13 | Disposition: A | Discharge status: Home / Self Care | Payer: BC Managed Care – PPO | Source: Ambulatory Visit | Attending: Surgery | Admitting: Surgery

## 2022-05-13 ENCOUNTER — Telehealth: Payer: Self-pay | Admitting: Skilled Nursing Facility1

## 2022-05-13 VITALS — BP 134/82 | HR 73 | Temp 98.7°F | Resp 16 | Ht 66.0 in | Wt 336.0 lb

## 2022-05-13 DIAGNOSIS — E119 Type 2 diabetes mellitus without complications: Secondary | ICD-10-CM | POA: Diagnosis not present

## 2022-05-13 DIAGNOSIS — Z01812 Encounter for preprocedural laboratory examination: Secondary | ICD-10-CM | POA: Insufficient documentation

## 2022-05-13 LAB — CBC WITH DIFFERENTIAL/PLATELET
Abs Immature Granulocytes: 0.01 10*3/uL (ref 0.00–0.07)
Basophils Absolute: 0 10*3/uL (ref 0.0–0.1)
Basophils Relative: 0 %
Eosinophils Absolute: 0.1 10*3/uL (ref 0.0–0.5)
Eosinophils Relative: 1 %
HCT: 42.6 % (ref 36.0–46.0)
Hemoglobin: 14.1 g/dL (ref 12.0–15.0)
Immature Granulocytes: 0 %
Lymphocytes Relative: 31 %
Lymphs Abs: 2.5 10*3/uL (ref 0.7–4.0)
MCH: 29.5 pg (ref 26.0–34.0)
MCHC: 33.1 g/dL (ref 30.0–36.0)
MCV: 89.1 fL (ref 80.0–100.0)
Monocytes Absolute: 0.5 10*3/uL (ref 0.1–1.0)
Monocytes Relative: 6 %
Neutro Abs: 5 10*3/uL (ref 1.7–7.7)
Neutrophils Relative %: 62 %
Platelets: 315 10*3/uL (ref 150–400)
RBC: 4.78 MIL/uL (ref 3.87–5.11)
RDW: 13.3 % (ref 11.5–15.5)
WBC: 8.1 10*3/uL (ref 4.0–10.5)
nRBC: 0 % (ref 0.0–0.2)

## 2022-05-13 LAB — COMPREHENSIVE METABOLIC PANEL
ALT: 80 U/L — ABNORMAL HIGH (ref 0–44)
AST: 70 U/L — ABNORMAL HIGH (ref 15–41)
Albumin: 3.7 g/dL (ref 3.5–5.0)
Alkaline Phosphatase: 100 U/L (ref 38–126)
Anion gap: 10 (ref 5–15)
BUN: 10 mg/dL (ref 6–20)
CO2: 24 mmol/L (ref 22–32)
Calcium: 9.1 mg/dL (ref 8.9–10.3)
Chloride: 104 mmol/L (ref 98–111)
Creatinine, Ser: 0.72 mg/dL (ref 0.44–1.00)
GFR, Estimated: >60 mL/min (ref >60)
Glucose, Bld: 167 mg/dL — ABNORMAL HIGH (ref 70–99)
Potassium: 3.8 mmol/L (ref 3.5–5.1)
Sodium: 138 mmol/L (ref 135–145)
Total Bilirubin: 0.5 mg/dL (ref 0.3–1.2)
Total Protein: 7.8 g/dL (ref 6.5–8.1)

## 2022-05-13 LAB — HEMOGLOBIN A1C
Hgb A1c MFr Bld: 6.7 % — ABNORMAL HIGH (ref 4.8–5.6)
Mean Plasma Glucose: 145.59 mg/dL

## 2022-05-13 LAB — GLUCOSE, CAPILLARY: Glucose-Capillary: 192 mg/dL — ABNORMAL HIGH (ref 70–99)

## 2022-05-13 NOTE — Telephone Encounter (Signed)
Returned pt's call. LVM.

## 2022-05-14 NOTE — Progress Notes (Signed)
ALT 80, results routed to Dr. Fredricka Bonine

## 2022-05-21 ENCOUNTER — Other Ambulatory Visit: Payer: Self-pay

## 2022-05-21 ENCOUNTER — Encounter (HOSPITAL_COMMUNITY)
Admission: RE | Disposition: A | Discharge status: Home / Self Care | Payer: Self-pay | Source: Home / Self Care | Attending: Surgery

## 2022-05-21 ENCOUNTER — Inpatient Hospital Stay (HOSPITAL_COMMUNITY): Payer: BC Managed Care – PPO | Admitting: Certified Registered Nurse Anesthetist

## 2022-05-21 ENCOUNTER — Inpatient Hospital Stay (HOSPITAL_COMMUNITY): Payer: BC Managed Care – PPO | Admitting: Physician Assistant

## 2022-05-21 ENCOUNTER — Ambulatory Visit: Payer: BC Managed Care – PPO

## 2022-05-21 ENCOUNTER — Encounter (HOSPITAL_COMMUNITY): Payer: Self-pay | Admitting: Surgery

## 2022-05-21 ENCOUNTER — Inpatient Hospital Stay (HOSPITAL_COMMUNITY)
Admission: RE | Admit: 2022-05-21 | Discharge: 2022-05-23 | DRG: 621 | Disposition: A | Discharge status: Home / Self Care | Payer: BC Managed Care – PPO | Attending: Surgery | Admitting: Surgery

## 2022-05-21 DIAGNOSIS — K219 Gastro-esophageal reflux disease without esophagitis: Secondary | ICD-10-CM | POA: Diagnosis present

## 2022-05-21 DIAGNOSIS — R632 Polyphagia: Secondary | ICD-10-CM | POA: Diagnosis present

## 2022-05-21 DIAGNOSIS — Z6841 Body Mass Index (BMI) 40.0 and over, adult: Secondary | ICD-10-CM

## 2022-05-21 DIAGNOSIS — F419 Anxiety disorder, unspecified: Secondary | ICD-10-CM | POA: Diagnosis present

## 2022-05-21 DIAGNOSIS — Z888 Allergy status to other drugs, medicaments and biological substances status: Secondary | ICD-10-CM | POA: Diagnosis not present

## 2022-05-21 DIAGNOSIS — K760 Fatty (change of) liver, not elsewhere classified: Secondary | ICD-10-CM | POA: Diagnosis present

## 2022-05-21 DIAGNOSIS — Z7984 Long term (current) use of oral hypoglycemic drugs: Secondary | ICD-10-CM

## 2022-05-21 DIAGNOSIS — F418 Other specified anxiety disorders: Secondary | ICD-10-CM | POA: Diagnosis not present

## 2022-05-21 DIAGNOSIS — Z79899 Other long term (current) drug therapy: Secondary | ICD-10-CM

## 2022-05-21 DIAGNOSIS — G4730 Sleep apnea, unspecified: Secondary | ICD-10-CM | POA: Diagnosis present

## 2022-05-21 DIAGNOSIS — Z818 Family history of other mental and behavioral disorders: Secondary | ICD-10-CM

## 2022-05-21 DIAGNOSIS — I10 Essential (primary) hypertension: Secondary | ICD-10-CM | POA: Diagnosis present

## 2022-05-21 DIAGNOSIS — G4733 Obstructive sleep apnea (adult) (pediatric): Secondary | ICD-10-CM | POA: Diagnosis not present

## 2022-05-21 DIAGNOSIS — Z8249 Family history of ischemic heart disease and other diseases of the circulatory system: Secondary | ICD-10-CM | POA: Diagnosis not present

## 2022-05-21 DIAGNOSIS — E119 Type 2 diabetes mellitus without complications: Secondary | ICD-10-CM

## 2022-05-21 HISTORY — PX: UPPER GI ENDOSCOPY: SHX6162

## 2022-05-21 HISTORY — PX: LAPAROSCOPIC GASTRIC SLEEVE RESECTION: SHX5895

## 2022-05-21 LAB — TYPE AND SCREEN
ABO/RH(D): O POS
Antibody Screen: NEGATIVE

## 2022-05-21 LAB — POCT PREGNANCY, URINE: Preg Test, Ur: NEGATIVE

## 2022-05-21 LAB — GLUCOSE, CAPILLARY: Glucose-Capillary: 151 mg/dL — ABNORMAL HIGH (ref 70–99)

## 2022-05-21 SURGERY — GASTRECTOMY, SLEEVE, LAPAROSCOPIC
Anesthesia: General | Site: Esophagus

## 2022-05-21 MED ORDER — HEPARIN SODIUM (PORCINE) 5000 UNIT/ML IJ SOLN
5000.0000 [IU] | Freq: Three times a day (TID) | INTRAMUSCULAR | Status: AC
  Administered 2022-05-21 – 2022-05-22 (×3): 5000 [IU] via SUBCUTANEOUS
  Filled 2022-05-21 (×2): qty 1

## 2022-05-21 MED ORDER — MIDAZOLAM HCL 5 MG/5ML IJ SOLN
INTRAMUSCULAR | Status: DC | PRN
  Administered 2022-05-21: 2 mg via INTRAVENOUS

## 2022-05-21 MED ORDER — LOSARTAN POTASSIUM 50 MG PO TABS
50.0000 mg | ORAL_TABLET | Freq: Every day | ORAL | Status: DC
  Administered 2022-05-22 – 2022-05-23 (×2): 50 mg via ORAL
  Filled 2022-05-21 (×2): qty 1

## 2022-05-21 MED ORDER — FENTANYL CITRATE (PF) 100 MCG/2ML IJ SOLN
INTRAMUSCULAR | Status: DC | PRN
  Administered 2022-05-21: 100 ug via INTRAVENOUS
  Administered 2022-05-21 (×2): 50 ug via INTRAVENOUS

## 2022-05-21 MED ORDER — EPHEDRINE SULFATE-NACL 50-0.9 MG/10ML-% IV SOSY
PREFILLED_SYRINGE | INTRAVENOUS | Status: DC | PRN
  Administered 2022-05-21: 5 mg via INTRAVENOUS
  Administered 2022-05-21 (×2): 7.5 mg via INTRAVENOUS

## 2022-05-21 MED ORDER — LIDOCAINE HCL (PF) 2 % IJ SOLN
INTRAMUSCULAR | Status: AC
  Filled 2022-05-21: qty 5

## 2022-05-21 MED ORDER — FENTANYL CITRATE PF 50 MCG/ML IJ SOSY
PREFILLED_SYRINGE | INTRAMUSCULAR | Status: AC
  Filled 2022-05-21: qty 2

## 2022-05-21 MED ORDER — FENTANYL CITRATE PF 50 MCG/ML IJ SOSY
25.0000 ug | PREFILLED_SYRINGE | INTRAMUSCULAR | Status: DC | PRN
  Administered 2022-05-21 (×3): 50 ug via INTRAVENOUS

## 2022-05-21 MED ORDER — GABAPENTIN 300 MG PO CAPS
300.0000 mg | ORAL_CAPSULE | ORAL | Status: AC
  Administered 2022-05-21: 300 mg via ORAL
  Filled 2022-05-21: qty 1

## 2022-05-21 MED ORDER — HYDRALAZINE HCL 20 MG/ML IJ SOLN: 10.0000 mg | INTRAMUSCULAR | Status: DC | PRN

## 2022-05-21 MED ORDER — TRAZODONE HCL 50 MG PO TABS
50.0000 mg | ORAL_TABLET | Freq: Every day | ORAL | Status: DC
  Administered 2022-05-21 – 2022-05-22 (×2): 50 mg via ORAL
  Filled 2022-05-21 (×2): qty 1

## 2022-05-21 MED ORDER — FENTANYL CITRATE (PF) 100 MCG/2ML IJ SOLN
INTRAMUSCULAR | Status: AC
  Filled 2022-05-21: qty 2

## 2022-05-21 MED ORDER — PANTOPRAZOLE SODIUM 40 MG IV SOLR
40.0000 mg | Freq: Every day | INTRAVENOUS | Status: DC
  Administered 2022-05-21 – 2022-05-22 (×2): 40 mg via INTRAVENOUS
  Filled 2022-05-21 (×2): qty 10

## 2022-05-21 MED ORDER — MELATONIN 5 MG PO TABS: 10.0000 mg | ORAL_TABLET | Freq: Every evening | ORAL | Status: DC | PRN

## 2022-05-21 MED ORDER — PROPOFOL 10 MG/ML IV BOLUS
INTRAVENOUS | Status: DC | PRN
  Administered 2022-05-21: 200 mg via INTRAVENOUS

## 2022-05-21 MED ORDER — ORAL CARE MOUTH RINSE: 15.0000 mL | Freq: Once | OROMUCOSAL | Status: AC

## 2022-05-21 MED ORDER — PHENYLEPHRINE 80 MCG/ML (10ML) SYRINGE FOR IV PUSH (FOR BLOOD PRESSURE SUPPORT)
PREFILLED_SYRINGE | INTRAVENOUS | Status: DC | PRN
  Administered 2022-05-21: 80 ug via INTRAVENOUS
  Administered 2022-05-21 (×2): 160 ug via INTRAVENOUS
  Administered 2022-05-21: 80 ug via INTRAVENOUS

## 2022-05-21 MED ORDER — APREPITANT 40 MG PO CAPS
40.0000 mg | ORAL_CAPSULE | ORAL | Status: AC
  Administered 2022-05-21: 40 mg via ORAL
  Filled 2022-05-21: qty 1

## 2022-05-21 MED ORDER — TRAMADOL HCL 50 MG PO TABS
50.0000 mg | ORAL_TABLET | Freq: Four times a day (QID) | ORAL | Status: DC | PRN
  Administered 2022-05-21: 50 mg via ORAL

## 2022-05-21 MED ORDER — BUPIVACAINE LIPOSOME 1.3 % IJ SUSP
INTRAMUSCULAR | Status: AC
  Filled 2022-05-21: qty 20

## 2022-05-21 MED ORDER — SUGAMMADEX SODIUM 500 MG/5ML IV SOLN
INTRAVENOUS | Status: DC | PRN
  Administered 2022-05-21: 400 mg via INTRAVENOUS

## 2022-05-21 MED ORDER — METHOCARBAMOL 500 MG IVPB - SIMPLE MED: 500.0000 mg | Freq: Four times a day (QID) | INTRAVENOUS | Status: DC | PRN

## 2022-05-21 MED ORDER — SCOPOLAMINE 1 MG/3DAYS TD PT72
1.0000 | MEDICATED_PATCH | TRANSDERMAL | Status: DC
  Administered 2022-05-21: 1.5 mg via TRANSDERMAL
  Filled 2022-05-21: qty 1

## 2022-05-21 MED ORDER — MELATONIN 10 MG PO TABS: 10.0000 mg | ORAL_TABLET | Freq: Every evening | ORAL | Status: DC | PRN

## 2022-05-21 MED ORDER — ROCURONIUM BROMIDE 10 MG/ML (PF) SYRINGE
PREFILLED_SYRINGE | INTRAVENOUS | Status: DC | PRN
  Administered 2022-05-21 (×2): 20 mg via INTRAVENOUS
  Administered 2022-05-21: 50 mg via INTRAVENOUS

## 2022-05-21 MED ORDER — HYDROMORPHONE HCL 1 MG/ML IJ SOLN
INTRAMUSCULAR | Status: AC
  Filled 2022-05-21: qty 2

## 2022-05-21 MED ORDER — BUPIVACAINE LIPOSOME 1.3 % IJ SUSP: 20.0000 mL | Freq: Once | INTRAMUSCULAR | Status: DC

## 2022-05-21 MED ORDER — CHLORHEXIDINE GLUCONATE 4 % EX LIQD: 60.0000 mL | Freq: Once | CUTANEOUS | Status: DC

## 2022-05-21 MED ORDER — 0.9 % SODIUM CHLORIDE (POUR BTL) OPTIME
TOPICAL | Status: DC | PRN
  Administered 2022-05-21: 1000 mL

## 2022-05-21 MED ORDER — PHENYLEPHRINE HCL-NACL 20-0.9 MG/250ML-% IV SOLN
INTRAVENOUS | Status: AC
  Filled 2022-05-21: qty 250

## 2022-05-21 MED ORDER — LACTATED RINGERS IR SOLN
Status: DC | PRN
  Administered 2022-05-21: 1000 mL

## 2022-05-21 MED ORDER — SUCCINYLCHOLINE CHLORIDE 200 MG/10ML IV SOSY
PREFILLED_SYRINGE | INTRAVENOUS | Status: AC
  Filled 2022-05-21: qty 10

## 2022-05-21 MED ORDER — TRAMADOL HCL 50 MG PO TABS
ORAL_TABLET | ORAL | Status: AC
  Filled 2022-05-21: qty 1

## 2022-05-21 MED ORDER — ACETAMINOPHEN 500 MG PO TABS
1000.0000 mg | ORAL_TABLET | Freq: Three times a day (TID) | ORAL | Status: DC
  Administered 2022-05-21 – 2022-05-22 (×3): 1000 mg via ORAL
  Filled 2022-05-21 (×3): qty 2

## 2022-05-21 MED ORDER — BUPIVACAINE LIPOSOME 1.3 % IJ SUSP
INTRAMUSCULAR | Status: DC | PRN
  Administered 2022-05-21: 20 mL

## 2022-05-21 MED ORDER — DEXAMETHASONE SODIUM PHOSPHATE 10 MG/ML IJ SOLN
INTRAMUSCULAR | Status: DC | PRN
  Administered 2022-05-21: 5 mg via INTRAVENOUS

## 2022-05-21 MED ORDER — HYDROMORPHONE HCL 1 MG/ML IJ SOLN: 0.5000 mg | INTRAMUSCULAR | Status: DC | PRN

## 2022-05-21 MED ORDER — PROPOFOL 10 MG/ML IV BOLUS
INTRAVENOUS | Status: AC
  Filled 2022-05-21: qty 20

## 2022-05-21 MED ORDER — AMISULPRIDE (ANTIEMETIC) 5 MG/2ML IV SOLN: 10.0000 mg | Freq: Once | INTRAVENOUS | Status: DC | PRN

## 2022-05-21 MED ORDER — METOCLOPRAMIDE HCL 5 MG/ML IJ SOLN
10.0000 mg | Freq: Four times a day (QID) | INTRAMUSCULAR | Status: DC
  Administered 2022-05-21 – 2022-05-23 (×7): 10 mg via INTRAVENOUS
  Filled 2022-05-21 (×7): qty 2

## 2022-05-21 MED ORDER — SIMETHICONE 80 MG PO CHEW: 80.0000 mg | CHEWABLE_TABLET | Freq: Four times a day (QID) | ORAL | Status: DC | PRN

## 2022-05-21 MED ORDER — FENTANYL CITRATE PF 50 MCG/ML IJ SOSY
PREFILLED_SYRINGE | INTRAMUSCULAR | Status: AC
  Filled 2022-05-21: qty 1

## 2022-05-21 MED ORDER — ONDANSETRON HCL 4 MG/2ML IJ SOLN
INTRAMUSCULAR | Status: AC
  Filled 2022-05-21: qty 2

## 2022-05-21 MED ORDER — OXYCODONE HCL 5 MG/5ML PO SOLN: 5.0000 mg | Freq: Once | ORAL | Status: DC | PRN

## 2022-05-21 MED ORDER — ONDANSETRON HCL 4 MG/2ML IJ SOLN
4.0000 mg | INTRAMUSCULAR | Status: DC | PRN
  Filled 2022-05-21: qty 2

## 2022-05-21 MED ORDER — PROMETHAZINE HCL 25 MG/ML IJ SOLN: 6.2500 mg | INTRAMUSCULAR | Status: DC | PRN

## 2022-05-21 MED ORDER — DEXAMETHASONE SODIUM PHOSPHATE 10 MG/ML IJ SOLN
INTRAMUSCULAR | Status: AC
  Filled 2022-05-21: qty 1

## 2022-05-21 MED ORDER — LIDOCAINE 2% (20 MG/ML) 5 ML SYRINGE
INTRAMUSCULAR | Status: DC | PRN
  Administered 2022-05-21: 60 mg via INTRAVENOUS

## 2022-05-21 MED ORDER — OXYCODONE HCL 5 MG/5ML PO SOLN
5.0000 mg | Freq: Four times a day (QID) | ORAL | Status: DC | PRN
  Administered 2022-05-21: 5 mg via ORAL
  Filled 2022-05-21: qty 5

## 2022-05-21 MED ORDER — SODIUM CHLORIDE 0.9 % IV SOLN
2.0000 g | INTRAVENOUS | Status: AC
  Administered 2022-05-21: 2 g via INTRAVENOUS
  Filled 2022-05-21: qty 2

## 2022-05-21 MED ORDER — HEPARIN SODIUM (PORCINE) 5000 UNIT/ML IJ SOLN
5000.0000 [IU] | INTRAMUSCULAR | Status: AC
  Administered 2022-05-21: 5000 [IU] via SUBCUTANEOUS
  Filled 2022-05-21: qty 1

## 2022-05-21 MED ORDER — DOCUSATE SODIUM 100 MG PO CAPS
100.0000 mg | ORAL_CAPSULE | Freq: Two times a day (BID) | ORAL | Status: DC
  Administered 2022-05-21 – 2022-05-23 (×4): 100 mg via ORAL
  Filled 2022-05-21 (×4): qty 1

## 2022-05-21 MED ORDER — SUCCINYLCHOLINE CHLORIDE 200 MG/10ML IV SOSY
PREFILLED_SYRINGE | INTRAVENOUS | Status: DC | PRN
  Administered 2022-05-21: 160 mg via INTRAVENOUS

## 2022-05-21 MED ORDER — ROCURONIUM BROMIDE 10 MG/ML (PF) SYRINGE
PREFILLED_SYRINGE | INTRAVENOUS | Status: AC
  Filled 2022-05-21: qty 10

## 2022-05-21 MED ORDER — MIDAZOLAM HCL 2 MG/2ML IJ SOLN
INTRAMUSCULAR | Status: AC
  Filled 2022-05-21: qty 2

## 2022-05-21 MED ORDER — GABAPENTIN 100 MG PO CAPS
200.0000 mg | ORAL_CAPSULE | Freq: Two times a day (BID) | ORAL | Status: DC
  Administered 2022-05-21 – 2022-05-23 (×4): 200 mg via ORAL
  Filled 2022-05-21 (×4): qty 2

## 2022-05-21 MED ORDER — SUGAMMADEX SODIUM 500 MG/5ML IV SOLN
INTRAVENOUS | Status: AC
  Filled 2022-05-21: qty 5

## 2022-05-21 MED ORDER — ACETAMINOPHEN 160 MG/5ML PO SOLN
1000.0000 mg | Freq: Three times a day (TID) | ORAL | Status: DC
  Administered 2022-05-22 – 2022-05-23 (×2): 1000 mg via ORAL
  Filled 2022-05-21 (×2): qty 40.6

## 2022-05-21 MED ORDER — ENSURE MAX PROTEIN PO LIQD
2.0000 [oz_av] | ORAL | Status: DC
  Administered 2022-05-22 – 2022-05-23 (×5): 2 [oz_av] via ORAL

## 2022-05-21 MED ORDER — DEXMEDETOMIDINE HCL IN NACL 80 MCG/20ML IV SOLN
INTRAVENOUS | Status: DC | PRN
  Administered 2022-05-21 (×2): 8 ug via BUCCAL

## 2022-05-21 MED ORDER — SERTRALINE HCL 100 MG PO TABS
100.0000 mg | ORAL_TABLET | Freq: Every day | ORAL | Status: DC
  Administered 2022-05-22 – 2022-05-23 (×2): 100 mg via ORAL
  Filled 2022-05-21 (×2): qty 1

## 2022-05-21 MED ORDER — HYDROMORPHONE HCL 1 MG/ML IJ SOLN
0.2500 mg | INTRAMUSCULAR | Status: DC | PRN
  Administered 2022-05-21: 0.5 mg via INTRAVENOUS

## 2022-05-21 MED ORDER — ONDANSETRON HCL 4 MG/2ML IJ SOLN
INTRAMUSCULAR | Status: DC | PRN
  Administered 2022-05-21: 4 mg via INTRAVENOUS

## 2022-05-21 MED ORDER — DEXMEDETOMIDINE HCL IN NACL 80 MCG/20ML IV SOLN
INTRAVENOUS | Status: AC
  Filled 2022-05-21: qty 20

## 2022-05-21 MED ORDER — SODIUM CHLORIDE 0.9 % IV SOLN: INTRAVENOUS | Status: DC

## 2022-05-21 MED ORDER — BUPIVACAINE HCL (PF) 0.25 % IJ SOLN
INTRAMUSCULAR | Status: DC | PRN
  Administered 2022-05-21: 30 mL

## 2022-05-21 MED ORDER — METOPROLOL TARTRATE 5 MG/5ML IV SOLN: 5.0000 mg | Freq: Four times a day (QID) | INTRAVENOUS | Status: DC | PRN

## 2022-05-21 MED ORDER — ACETAMINOPHEN 500 MG PO TABS
1000.0000 mg | ORAL_TABLET | ORAL | Status: AC
  Administered 2022-05-21: 1000 mg via ORAL
  Filled 2022-05-21: qty 2

## 2022-05-21 MED ORDER — CHLORHEXIDINE GLUCONATE 0.12 % MT SOLN
15.0000 mL | Freq: Once | OROMUCOSAL | Status: AC
  Administered 2022-05-21: 15 mL via OROMUCOSAL

## 2022-05-21 MED ORDER — OXYCODONE HCL 5 MG PO TABS: 5.0000 mg | ORAL_TABLET | Freq: Once | ORAL | Status: DC | PRN

## 2022-05-21 MED ORDER — BUPIVACAINE HCL (PF) 0.25 % IJ SOLN
INTRAMUSCULAR | Status: AC
  Filled 2022-05-21: qty 30

## 2022-05-21 MED ORDER — LACTATED RINGERS IV SOLN
INTRAVENOUS | Status: DC
  Administered 2022-05-21: 1000 mL via INTRAVENOUS

## 2022-05-21 SURGICAL SUPPLY — 78 items
APL PRP STRL LF DISP 70% ISPRP (MISCELLANEOUS) ×4
APL SKNCLS STERI-STRIP NONHPOA (GAUZE/BANDAGES/DRESSINGS) ×2
APPLIER CLIP ROT 10 11.4 M/L (STAPLE)
APPLIER CLIP ROT 13.4 12 LRG (CLIP)
APR CLP LRG 13.4X12 ROT 20 MLT (CLIP)
APR CLP MED LRG 11.4X10 (STAPLE)
BAG COUNTER SPONGE SURGICOUNT (BAG) IMPLANT
BAG LAPAROSCOPIC 12 15 PORT 16 (BASKET) IMPLANT
BAG RETRIEVAL 12/15 (BASKET)
BAG SPNG CNTER NS LX DISP (BAG)
BENZOIN TINCTURE PRP APPL 2/3 (GAUZE/BANDAGES/DRESSINGS) ×2 IMPLANT
BLADE SURG SZ11 CARB STEEL (BLADE) ×2 IMPLANT
BNDG ADH 1X3 SHEER STRL LF (GAUZE/BANDAGES/DRESSINGS) ×12 IMPLANT
BNDG ADH THN 3X1 STRL LF (GAUZE/BANDAGES/DRESSINGS) ×12
CABLE HIGH FREQUENCY MONO STRZ (ELECTRODE) IMPLANT
CHLORAPREP W/TINT 26 (MISCELLANEOUS) ×6 IMPLANT
CLIP APPLIE ROT 10 11.4 M/L (STAPLE) IMPLANT
CLIP APPLIE ROT 13.4 12 LRG (CLIP) IMPLANT
COVER SURGICAL LIGHT HANDLE (MISCELLANEOUS) ×3 IMPLANT
DEVICE SUT QUICK LOAD TK 5 (SUTURE) IMPLANT
DEVICE SUT TI-KNOT TK 5X26 (SUTURE) IMPLANT
DRAPE UTILITY XL STRL (DRAPES) ×6 IMPLANT
ELECT REM PT RETURN 15FT ADLT (MISCELLANEOUS) ×3 IMPLANT
GAUZE SPONGE 4X4 12PLY STRL (GAUZE/BANDAGES/DRESSINGS) IMPLANT
GLOVE BIO SURGEON STRL SZ 6 (GLOVE) ×3 IMPLANT
GLOVE INDICATOR 6.5 STRL GRN (GLOVE) ×3 IMPLANT
GLOVE SS BIOGEL STRL SZ 6 (GLOVE) ×2 IMPLANT
GOWN STRL REUS W/ TWL LRG LVL3 (GOWN DISPOSABLE) ×3 IMPLANT
GOWN STRL REUS W/ TWL XL LVL3 (GOWN DISPOSABLE) IMPLANT
GOWN STRL REUS W/TWL LRG LVL3 (GOWN DISPOSABLE) ×2
GOWN STRL REUS W/TWL XL LVL3 (GOWN DISPOSABLE)
GRASPER SUT TROCAR 14GX15 (MISCELLANEOUS) ×3 IMPLANT
IRRIG SUCT STRYKERFLOW 2 WTIP (MISCELLANEOUS) ×2
IRRIGATION SUCT STRKRFLW 2 WTP (MISCELLANEOUS) ×3 IMPLANT
KIT BASIN OR (CUSTOM PROCEDURE TRAY) ×3 IMPLANT
KIT TURNOVER KIT A (KITS) IMPLANT
MARKER SKIN DUAL TIP RULER LAB (MISCELLANEOUS) ×2 IMPLANT
MAT PREVALON FULL STRYKER (MISCELLANEOUS) ×3 IMPLANT
NDL SPNL 22GX3.5 QUINCKE BK (NEEDLE) ×3 IMPLANT
NEEDLE SPNL 22GX3.5 QUINCKE BK (NEEDLE) ×2 IMPLANT
PACK UNIVERSAL I (CUSTOM PROCEDURE TRAY) ×3 IMPLANT
RELOAD ENDO STITCH (ENDOMECHANICALS) IMPLANT
RELOAD STAPLE 60 3.6 BLU REG (STAPLE) ×3 IMPLANT
RELOAD STAPLE 60 3.8 GOLD REG (STAPLE) ×3 IMPLANT
RELOAD STAPLE 60 4.1 GRN THCK (STAPLE) IMPLANT
RELOAD STAPLER BLUE 60MM (STAPLE) ×12 IMPLANT
RELOAD STAPLER GOLD 60MM (STAPLE) ×2 IMPLANT
RELOAD STAPLER GREEN 60MM (STAPLE) IMPLANT
RELOAD SUT TRIPLE-STITCH 2-0 (ENDOMECHANICALS) IMPLANT
SCISSORS LAP 5X45 EPIX DISP (ENDOMECHANICALS) ×3 IMPLANT
SET TUBE SMOKE EVAC HIGH FLOW (TUBING) ×2 IMPLANT
SHEARS HARMONIC ACE PLUS 45CM (MISCELLANEOUS) ×3 IMPLANT
SLEEVE ADV FIXATION 5X100MM (TROCAR) ×6 IMPLANT
SLEEVE GASTRECTOMY 40FR VISIGI (MISCELLANEOUS) ×3 IMPLANT
SOL ANTI FOG 6CC (MISCELLANEOUS) ×3 IMPLANT
SOLUTION ANTI FOG 6CC (MISCELLANEOUS) ×2
SPIKE FLUID TRANSFER (MISCELLANEOUS) ×3 IMPLANT
SPONGE T-LAP 18X18 ~~LOC~~+RFID (SPONGE) ×3 IMPLANT
STAPLE LINE REINFORCEMENT LAP (STAPLE) IMPLANT
STAPLER ECHELON LONG 3000 60 (ENDOMECHANICALS) IMPLANT
STAPLER RELOAD BLUE 60MM (STAPLE) ×12
STAPLER RELOAD GOLD 60MM (STAPLE) ×2
STAPLER RELOAD GREEN 60MM (STAPLE)
STRIP CLOSURE SKIN 1/2X4 (GAUZE/BANDAGES/DRESSINGS) ×3 IMPLANT
SUT MNCRL AB 4-0 PS2 18 (SUTURE) ×3 IMPLANT
SUT SURGIDAC NAB ES-9 0 48 120 (SUTURE) IMPLANT
SUT VICRYL 0 TIES 12 18 (SUTURE) ×3 IMPLANT
SYR 10ML ECCENTRIC (SYRINGE) ×2 IMPLANT
SYR 20ML LL LF (SYRINGE) ×3 IMPLANT
SYR 50ML LL SCALE MARK (SYRINGE) ×3 IMPLANT
SYS KII OPTICAL ACCESS 15MM (TROCAR) ×2
SYSTEM KII OPTICAL ACCESS 15MM (TROCAR) ×3 IMPLANT
TOWEL OR 17X26 10 PK STRL BLUE (TOWEL DISPOSABLE) ×3 IMPLANT
TOWEL OR NON WOVEN STRL DISP B (DISPOSABLE) ×2 IMPLANT
TROCAR ADV FIXATION 5X100MM (TROCAR) ×2 IMPLANT
TROCAR XCEL NON-BLD 5MMX100MML (ENDOMECHANICALS) ×2 IMPLANT
TUBING CONNECTING 10 (TUBING) ×3 IMPLANT
TUBING ENDO SMARTCAP (MISCELLANEOUS) ×3 IMPLANT

## 2022-05-21 NOTE — Progress Notes (Signed)
Discussed QI "Goals for Discharge" document with patient including ambulation in halls, Incentive Spirometry use every hour, and oral care.  Also discussed pain and nausea control.  BSTOP education provided including BSTOP information guide, "Guide for Pain Management after your Bariatric Procedure".  Diet progression education provided including "Bariatric Surgery Post-Op Food Plan Phase 1: Liquids".  Questions answered.  Will continue to partner with bedside RN and follow up with patient per protocol.  

## 2022-05-21 NOTE — Transfer of Care (Signed)
Immediate Anesthesia Transfer of Care Note  Patient: Lori Clements  Procedure(s) Performed: LAPAROSCOPIC SLEEVE GASTRECTOMY (Abdomen) UPPER GI ENDOSCOPY (Esophagus)  Patient Location: PACU  Anesthesia Type:General  Level of Consciousness: drowsy and patient cooperative  Airway & Oxygen Therapy: Patient Spontanous Breathing and Patient connected to face mask oxygen  Post-op Assessment: Report given to RN and Post -op Vital signs reviewed and stable  Post vital signs: Reviewed and stable  Last Vitals:  Vitals Value Taken Time  BP 104/85 05/21/22 0931  Temp    Pulse 85 05/21/22 0935  Resp 10 05/21/22 0934  SpO2 100 % 05/21/22 0935  Vitals shown include unvalidated device data.  Last Pain:  Vitals:   05/21/22 0930  TempSrc:   PainSc: 6          Complications: No notable events documented.

## 2022-05-21 NOTE — Discharge Instructions (Signed)
GASTRIC BYPASS / SLEEVE  Home Care Instructions  These instructions are to help you care for yourself when you go home.  Call: If you have any problems. Call [phone] and ask for the surgeon on call If you have an emergency related to your surgery please use the ER at [facility].  Tell the ER staff that you are a new post-op gastric bypass or gastric sleeve patient   Signs and symptoms to report: Severe vomiting or nausea If you cannot handle clear liquids for longer than 1 day, call your surgeon  Abdominal pain which does not get better after taking your pain medication Fever greater than 100.4 F and chills Heart rate over 100 beats a minute Trouble breathing Chest pain  Redness, swelling, drainage, or foul odor at incision (surgical) sites  If your incisions open or pull apart Swelling or pain in calf (lower leg) Diarrhea (Loose bowel movements that happen often), frequent watery, uncontrolled bowel movements Constipation, (no bowel movements for 3 days) if this happens:  Take Milk of Magnesia, 2 tablespoons by mouth, 3 times a day for 2 days if needed Stop taking Milk of Magnesia once you have had a bowel movement Call your doctor if constipation continues Or Take Miralax  (instead of Milk of Magnesia) following the label instructions Stop taking Miralax once you have had a bowel movement Call your doctor if constipation continues Anything you think is "abnormal for you"   Normal side effects after surgery: Unable to sleep at night or unable to concentrate Irritability Being tearful (crying) or depressed These are common complaints, possibly related to your anesthesia, stress of surgery and change in lifestyle, that usually go away a few weeks after surgery.  If these feelings continue, call your medical doctor.  Wound Care: You may have surgical glue, steri-strips, or staples over your incisions after surgery Surgical glue:  Looks like a clear film over your incisions  and will wear off a little at a time Steri-strips : Adhesive strips of tape over your incisions. You may notice a yellowish color on the skin under the steri-strips. This is used to make the   steri-strips stick better. Do not pull the steri-strips off - let them fall off Staples: Staples may be removed before you leave the hospital If you go home with staples, call [facility] at for an appointment with your surgeon's nurse to have staples removed 10 days after surgery, [phone] Showering: You may shower two (2) days after your surgery unless your surgeon tells you differently Wash gently around incisions with warm soapy water, rinse well, and gently pat dry  If you have a drain (tube from your incision), you may need someone to hold this while you shower  No tub baths until staples are removed and incisions are healed     Medications: Medications should be liquid or crushed if larger than the size of a dime Extended release pills (medication that releases a little bit at a time through the day) should not be crushed Depending on the size and number of medications you take, you may need to space (take a few throughout the day)/change the time you take your medications so that you do not over-fill your pouch (smaller stomach) Make sure you follow-up with your primary care physician to make medication changes needed during rapid weight loss and life-style changes If you have diabetes, follow up with the doctor that orders your diabetes medication(s) within one week after surgery and check   your blood sugar regularly. Do not drive while taking narcotics (pain medications) DO NOT take NSAID'S (Examples of NSAID's include ibuprofen, naproxen)  Diet:                    First 2 Weeks  You will see the nutritionist about two (2) weeks after your surgery. The nutritionist will increase the types of foods you can eat if you are handling liquids well: If you have severe vomiting or nausea  and cannot handle clear liquids lasting longer than 1 day, call your surgeon  Protein Shake Drink at least 2 ounces of shake 5-6 times per day Each serving of protein shakes (usually 8 - 12 ounces) should have a minimum of:  15 grams of protein  And no more than 5 grams of carbohydrate  Goal for protein each day: Men = 80 grams per day Women = 60 grams per day Protein powder may be added to fluids such as non-fat milk or Lactaid milk or Soy milk (limit to 35 grams added protein powder per serving)  Hydration Slowly increase the amount of water and other clear liquids as tolerated (See Acceptable Fluids) Slowly increase the amount of protein shake as tolerated   Sip fluids slowly and throughout the day May use sugar substitutes in small amounts (no more than 6 - 8 packets per day; i.e. Splenda)  Fluid Goal The first goal is to drink at least 8 ounces of protein shake/drink per day (or as directed by the nutritionist);  See handout from pre-op Bariatric Education Class for examples of protein shake/drink.   Slowly increase the amount of protein shake you drink as tolerated You may find it easier to slowly sip shakes throughout the day It is important to get your proteins in first Your fluid goal is to drink 64 - 100 ounces of fluid daily It may take a few weeks to build up to this 32 oz (or more) should be clear liquids  And  32 oz (or more) should be full liquids (see below for examples) Liquids should not contain sugar, caffeine, or carbonation  Clear Liquids: Water or Sugar-free flavored water (i.e. Fruit H2O, Propel) Decaffeinated coffee or tea (sugar-free) Kielee Care Lite, Wyler's Lite, Minute Maid Lite Sugar-free Jell-O Bouillon or broth Sugar-free Popsicle:   *Less than 20 calories each; Limit 1 per day  Full Liquids: Protein Shakes/Drinks + 2 choices per day of other full liquids Full liquids must be: No More Than 12 grams of Carbs per serving  No More Than 3 grams of Fat  per serving Strained low-fat cream soup Non-Fat milk Fat-free Lactaid Milk Sugar-free yogurt (Dannon Lite & Fit, Greek yogurt)      Vitamins and Minerals Start 1 day after surgery unless otherwise directed by your surgeon Bariatric Specific Complete Multivitamins Chewable Calcium Citrate with Vitamin D-3 (Example: 3 Chewable Calcium Plus 600 with Vitamin D-3) Take 500 mg three (3) times a day for a total of 1500 mg each day Do not take all 3 doses of calcium at one time as it may cause constipation, and you can only absorb 500 mg  at a time  Do not mix multivitamins containing iron with calcium supplements; take 2 hours apart  Menstruating women and those at risk for anemia (a blood disease that causes weakness) may need extra iron Talk with your doctor to see if you need more iron If you need extra iron: Total daily Iron recommendation (including Vitamins) is 50 to 100   mg Iron/day Do not stop taking or change any vitamins or minerals until you talk to your nutritionist or surgeon Your nutritionist and/or surgeon must approve all vitamin and mineral supplements   Activity and Exercise: It is important to continue walking at home.  Limit your physical activity as instructed by your doctor.  During this time, use these guidelines: Do not lift anything greater than ten (10) pounds for at least two (2) weeks Do not go back to work or drive until your surgeon says you can You may have sex when you feel comfortable  It is VERY important for female patients to use a reliable birth control method; fertility often increases after surgery  Do not get pregnant for at least 18 months Start exercising as soon as your doctor tells you that you can Make sure your doctor approves any physical activity Start with a simple walking program Walk 5-15 minutes each day, 7 days per week.  Slowly increase until you are walking 30-45 minutes per day Consider joining our BELT program. [phone] or email  [email or web address]   Special Instructions Things to remember:  Use your CPAP when sleeping if this applies to you, do not stop the use of CPAP unless directed by physician after a sleep study [facility] has a free Bariatric Surgery Support Group that meets monthly, the 3rd Thursday, 6 pm.  Please review discharge information for date and location of this meeting. It is very important to keep all follow up appointments with your surgeon, nutritionist, primary care physician, and behavioral health practitioner After the first year, please follow up with your bariatric surgeon and nutritionist at least once a year in order to maintain best weight loss results   [facility]: [phone] [facility]: [phone] Bariatric Nurse Coordinator: [phone]     Enoxaparin (Lovenox) Self- Injection Instructions Wash your hands with soap and water Find a spot on the left or right side of the abdomen  2 inches from umbilicus "belly button"- DO NOT INJECT INTO INCISIONS Make sure to rotate sites (Do not inject in the same spot twice in a row) Clean the spot with alcohol swab, LET DRY Pull cap straight off, do not twist (do not let needle touch anything)Ok to leave in air bubble Place syringe in dominant (writing) hand, take other hand to "pinch an inch" (same area that you cleaned with the alcohol swab) Insert the entire needle into the fold of skin at a 90-degree angle Press the plunger all the way down with your thumb while the other hand keeps "pinching" the skin (Make sure to get all the medicine) Pull the needle straight out, then let go of the skin To activate the safety shield, hold the plunger away from yourself (and anyone else if necessary) Press with your thumb until you hear a "click" and the safety shield activates Place the used syringe and cap into the sharps disposal container.  

## 2022-05-21 NOTE — Anesthesia Procedure Notes (Signed)
Procedure Name: Intubation Date/Time: 05/21/2022 7:35 AM  Performed by: West Pugh, CRNAPre-anesthesia Checklist: Patient identified, Emergency Drugs available, Suction available, Patient being monitored and Timeout performed Patient Re-evaluated:Patient Re-evaluated prior to induction Oxygen Delivery Method: Circle system utilized Preoxygenation: Pre-oxygenation with 100% oxygen Induction Type: IV induction, Rapid sequence and Cricoid Pressure applied Laryngoscope Size: Mac and 4 Grade View: Grade I Tube type: Oral Tube size: 7.0 mm Number of attempts: 1 Airway Equipment and Method: Stylet Placement Confirmation: ETT inserted through vocal cords under direct vision, positive ETCO2, CO2 detector and breath sounds checked- equal and bilateral Secured at: 21 cm Tube secured with: Tape Dental Injury: Teeth and Oropharynx as per pre-operative assessment

## 2022-05-21 NOTE — Anesthesia Postprocedure Evaluation (Signed)
Anesthesia Post Note  Patient: Lori Clements  Procedure(s) Performed: LAPAROSCOPIC SLEEVE GASTRECTOMY (Abdomen) UPPER GI ENDOSCOPY (Esophagus)     Patient location during evaluation: PACU Anesthesia Type: General Level of consciousness: awake Pain management: pain level controlled Vital Signs Assessment: post-procedure vital signs reviewed and stable Respiratory status: spontaneous breathing, nonlabored ventilation and respiratory function stable Cardiovascular status: blood pressure returned to baseline and stable Postop Assessment: no apparent nausea or vomiting Anesthetic complications: no   No notable events documented.  Last Vitals:  Vitals:   05/21/22 1348 05/21/22 1447  BP: 132/78 138/77  Pulse: 76 62  Resp: 16 16  Temp: 36.7 C 36.9 C  SpO2: 96% 96%    Last Pain:  Vitals:   05/21/22 1447  TempSrc: Oral  PainSc:                  Lori Clements

## 2022-05-21 NOTE — Anesthesia Preprocedure Evaluation (Addendum)
Anesthesia Evaluation  Patient identified by MRN, date of birth, ID band Patient awake    Reviewed: Allergy & Precautions, NPO status , Patient's Chart, lab work & pertinent test results  Airway Mallampati: III  TM Distance: >3 FB Neck ROM: Full    Dental no notable dental hx.    Pulmonary sleep apnea    Pulmonary exam normal        Cardiovascular hypertension, Pt. on medications Normal cardiovascular exam     Neuro/Psych  PSYCHIATRIC DISORDERS Anxiety Depression    negative neurological ROS     GI/Hepatic negative GI ROS, Neg liver ROS,,,  Endo/Other  diabetes, Oral Hypoglycemic Agents  Morbid obesity  Renal/GU negative Renal ROS     Musculoskeletal negative musculoskeletal ROS (+)    Abdominal   Peds  Hematology negative hematology ROS (+)   Anesthesia Other Findings MORBID OBESITY  Reproductive/Obstetrics                             Anesthesia Physical Anesthesia Plan  ASA: 4  Anesthesia Plan: General   Post-op Pain Management:    Induction: Intravenous and Rapid sequence  PONV Risk Score and Plan: 4 or greater and Ondansetron, Dexamethasone, Midazolam, Scopolamine patch - Pre-op and Treatment may vary due to age or medical condition  Airway Management Planned: Oral ETT  Additional Equipment: ClearSight  Intra-op Plan:   Post-operative Plan: Extubation in OR  Informed Consent: I have reviewed the patients History and Physical, chart, labs and discussed the procedure including the risks, benefits and alternatives for the proposed anesthesia with the patient or authorized representative who has indicated his/her understanding and acceptance.     Dental advisory given  Plan Discussed with: CRNA  Anesthesia Plan Comments:        Anesthesia Quick Evaluation

## 2022-05-21 NOTE — H&P (Signed)
Lori Clements [id number]    Referring Provider:  Self  Dr. Thomasene Lot     Subjective    Chief Complaint: Return Weight Loss (SX date: 05/06/22 Lap Sleeve)       History of Present Illness: Follow up regarding surgical tx of morbid obesity. Had a cervical conization last  month but otherwise no changes in her health. Sleeve is scheduled for 11/13. She has halted her follow up with [facility] for now, but may benefit from seeing them again in the future. UGI 09/27/21- neg, no hh, no reflux CXR 09/27/21- neg Dietician- cleared 11/27/21 Jon Gills [place] )    Initial visit 09/06/21: 26 year old woman with multiple medical problems as listed below who presents for consultation regarding surgical treatment of morbid obesity.  She has been struggling with her weight for many years and has been unsuccessful with weight loss, weight loss maintenance despite multiple attempts.  These have included multiple diets, phentermine, exercise programs.  She had good success with 6 months on the keto diet, but once she stopped she regained all the weight that she had lost.  She has recently started work with the [facility] and is hopeful to see some progress through continue to work there.    She does have sleep apnea requiring CPAP, and notes reflux which was previously on an over-the-counter intermittent medication, but the patient reports that she takes something daily which is prescribed with good control of symptoms.  Notes association between food choices and reflux as well.  She also endorses a very severe anxiety and IBS type symptoms.  Noted to have a hemoglobin A1c of 6.8 on her most recent labs a couple weeks ago, a new diagnosis.   Previous abdominal surgeries.   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  She lives with her fianc, Runner, broadcasting/film/video, who also suffers with obesity, and reports eating out 5+ days per week due to busy schedules.  Per primary care notes has significant food cravings, frequent snacking especially in the evenings, drinks high-calorie liquids, frequent poor food choices, problems with excessive hunger and frequently eats larger portions than normal with binge eating behaviors as well as emotional eating.  She denies any tobacco, drug or alcohol use.   Her primary goal in addition to improving her health is to be able to have children in the next few years.     Review of Systems: A complete review of systems was obtained from the patient.  I have reviewed this information and discussed as appropriate with the patient.  See HPI as well for other ROS.     Medical History:     Past Medical History:  Diagnosis Date   Anxiety     Hypertension     Sleep apnea        There is no problem list on file for this patient.          Past Surgical History:  Procedure Laterality Date   wisdom teeth removal N/A             Allergies  Allergen Reactions   Naproxen Other (See Comments) and Rash      hematuria hematuria              Current Outpatient Medications on File Prior to Visit  Medication Sig Dispense  Refill   famotidine (PEPCID) 20 MG tablet 1 TABLET AS NEEDED FOR REFLUX/HEARTBURN ORALLY ONCE A DAY 30 DAY(S)       losartan (COZAAR) 25 MG tablet 1 tablet       metFORMIN (GLUCOPHAGE) 500 MG tablet Take 1,000 mg by mouth 2 (two) times daily       sertraline (ZOLOFT) 100 MG tablet Take 100 mg by mouth once daily       traZODone (DESYREL) 50 MG tablet TAKE 1/2 TO 1 TABLET BY MOUTH AT BEDTIME AS NEEDED        No current facility-administered medications on file prior to visit.           Family History  Problem Relation Age of Onset   Obesity Mother     High blood pressure (Hypertension) Mother     Obesity Sister        Social History       Tobacco  Use  Smoking Status Never  Smokeless Tobacco Never      Social History        Socioeconomic History   Marital status: Divorced  Tobacco Use   Smoking status: Never   Smokeless tobacco: Never  Vaping Use   Vaping Use: Never used  Substance and Sexual Activity   Alcohol use: Yes   Drug use: Never      Objective:         Vitals:    04/10/22 1509  BP: 126/72  Pulse: (!) 123  SpO2: 98%  Weight: (!) 157.1 kg (346 lb 6.4 oz)  Height: 167.6 cm (5\' 6" )      Body mass index is 55.91 kg/m.   Alert and well-appearing Unlabored respirations   Assessment and Plan:  There are no diagnoses linked to this encounter.   She remains a good candidate for bariatric surgery.  We have previously discussed in depth the technique and risks/benefits profiles of both the sleeve gastrectomy and the Roux-en-Y gastric bypass.  Although she does have reflux, this is well controlled with dietary choices and medication and in my opinion given her young age, health profile, and desire to have a successful pregnancy in the near future, a sleeve gastrectomy would be a safer and just as efficacious option for her despite small chance of worsened reflux postoperatively.  We had a long discussion about this, and ultimately she has decided on sleeve gastrectomy.  Initially the patient's plan was to embark on the preoperative pathway while continuing her work with [facility].  Given the above behavioral health elements noted in the HPI, we'd hoped for success with weight loss if she is able to make some behavioral changes and she may be able to avoid surgery. On initiating the surgery pathway she felt that this was a lot of appointments and so chose to narrow down to focusing on surgery for now. I think this is reasonable and discussed that she may have benefit from ongoing follow up with them post op.  Initial visit 3/16- 337lb 10/18: 346 BMI 30~185lb Her goal is to reach a weight of 175  pounds.   Plan to proceed as scheduled on 11/13     Khori Underberg Raquel James, MD

## 2022-05-21 NOTE — Progress Notes (Signed)
PHARMACY CONSULT FOR:  Risk Assessment for Post-Discharge VTE Following Bariatric Surgery  Post-Discharge VTE Risk Assessment: This patient's probability of 30-day post-discharge VTE is increased due to the factors marked: x Sleeve gastrectomy  x Liver disorder (transplant, cirrhosis, or nonalcoholic steatohepatitis)   Hx of VTE   Hemorrhage requiring transfusion   GI perforation, leak, or obstruction   ====================================================    Female    Age >/=60 years   x  BMI >/=50 kg/m2    CHF    Dyspnea at Rest    Paraplegia  x Non-gastric-band surgery    Operation Time >/=3 hr    Return to OR     Length of Stay >/= 3 d   Hypercoagulable condition   Significant venous stasis      Predicted probability of 30-day post-discharge VTE:  Patient with 2 or more risk factors for portomesenteric vein thrombosis including sleeve gastrectomy + severe hepatic steatosis w/ hepatomegaly (as documented by Dr. Fredricka Bonine in operative note 11/28)  Other patient-specific factors to consider: see above   Recommendation for Discharge: Enoxaparin 60 mg [place] q12h x 30 days post-discharge      Lori Clements is a 26 y.o. female who underwent sleeve gastrectomy on 05/21/22   Case start: 0800 Case end: 0915   Allergies  Allergen Reactions   Naproxen     hematuria    Patient Measurements: Height: 5\' 6"  (167.6 cm) Weight: (!) 153 kg (337 lb 4.9 oz) IBW/kg (Calculated) : 59.3 Body mass index is 54.44 kg/m.  No results for input(s): "WBC", "HGB", "HCT", "PLT", "APTT", "CREATININE", "LABCREA", "CREAT24HRUR", "MG", "PHOS", "ALBUMIN", "PROT", "AST", "ALT", "ALKPHOS", "BILITOT", "BILIDIR", "IBILI" in the last 72 hours. Estimated Creatinine Clearance: 160 mL/min (by C-G formula based on SCr of 0.72 mg/dL).    Past Medical History:  Diagnosis Date   Anxiety    Back pain    Depression    Diabetes mellitus without complication (HCC) 08/21/2021   08/21/21 A1c 6.8%    Elevated liver function tests 08/16/2021   Essential hypertension    GERD (gastroesophageal reflux disease)    IBS (irritable bowel syndrome)    Other fatigue    Shortness of breath on exertion    Sleep apnea    Thyroid disease      Medications Prior to Admission  Medication Sig Dispense Refill Last Dose   calcium carbonate (TUMS EX) 750 MG chewable tablet Chew 2 tablets by mouth 2 (two) times daily as needed for heartburn.   Past Week   Etonogestrel (IMPLANON [place]) Inject 1 each into the skin See admin instructions.   currently implanted - Right arm   losartan (COZAAR) 50 MG tablet Take 50 mg by mouth daily.   05/20/2022   Melatonin 10 MG TABS Take 10 mg by mouth at bedtime as needed (sleep).   05/16/2022   metFORMIN (GLUCOPHAGE) 500 MG tablet Take 1,000 mg by mouth 2 (two) times daily.   05/20/2022   sertraline (ZOLOFT) 100 MG tablet Take 100 mg by mouth daily.   05/21/2022 at 0430   traZODone (DESYREL) 50 MG tablet Take 50 mg by mouth at bedtime.   Past Week   Cholecalciferol (VITAMIN D) 125 MCG (5000 UT) CAPS Take 10,000 Units by mouth daily.   05/16/2022       05/18/2022, PharmD, BCPS Clinical Pharmacist 05/21/2022 11:45 AM

## 2022-05-21 NOTE — Op Note (Signed)
Operative Note  Lori Clements  [id number]  [id number]  05/21/2022   Surgeon: Phylliss Blakes MD FACS   Assistant: Feliciana Rossetti MD FACS   Procedure performed: laparoscopic sleeve gastrectomy, upper endoscopy   Preop diagnosis: Morbid obesity Body mass index is 54.44 kg/m. Post-op diagnosis/intraop findings: same, severe hepatic steatosis with hepatomegaly, dilated/variceal short gastric vessels   Specimens: fundus Retained items: none  EBL: minimal  Complications: none   Description of procedure: After confirming informed consent and administration of chemical DVT prophylaxis in holding, the patient was taken to the operating room and placed supine on operating room table where general endotracheal anesthesia was initiated, preoperative antibiotics were administered, SCDs applied, and a formal timeout was performed. The abdomen was prepped and draped in usual sterile fashion. Peritoneal access was gained using a Visiport technique in the left upper quadrant and insufflation to 15 mmHg ensued without issue. Gross inspection revealed no evidence of injury.  The liver is noted to be quite enlarged with changes of severe hepatic steatosis, see photo.  Under direct visualization three more 5 mm trochars were placed in the right and left hemiabdomen and the 18mm trocar in the right paramedian upper abdomen. Bilateral laparoscopic assisted TAPS blocks were performed with Exparel diluted with 0.25 percent Marcaine. The patient was placed in steep reverseTrendelenburg and the liver retractor was introduced through an incision in the upper midline and secured to the post externally to maintain the left lobe retracted anteriorly. There is no hiatal hernia on direct inspection.  Using the Harmonic scalpel, the greater curvature of the stomach was dissected away from the greater omentum and short gastric vessels were divided. This began 6 cm from the pylorus, and dissection proceeded until the left  crus was clearly exposed.  The most cephalad short gastric vessels were extremely dilated, but were able to be divided with excellent hemostasis with the harmonic scalpel.  The 13 [place] VisiGi was then introduced and directed down towards the pylorus. This was placed to suction against the lesser curve. Serial fires of the linear cutting stapler with staple line reinforcements were then employed to create our sleeve. The first fire used a gold load and ensured adequate room at the angularis incisura. Several blue loads were then employed to create an evenly tubular stomach, preserving 1 cm lateral to the angle of His.  At this point, the insufflation pressure was decreased to 8 mmHg.  The excised stomach was then removed through our 15 mm trocar site within an Endo Catch bag.  The visigi was taken off of suction and a few puffs of air were introduced, inflating the sleeve. No bubbles were observed in the irrigation fluid around the stomach and the shape was noted to be evenly tubular without any narrowing at the angularis. The visigi was then removed. Upper endoscopy was performed by the assistant surgeon and the sleeve was noted to be airtight, the staple line was hemostatic. Please see his separate note. The endoscope was removed. A minimal amount of oozing on the most distal (gold) staple line was addressed with clips. The 15 mm trocar site fascia in the right upper abdomen was closed with a 0 Vicryl using the laparoscopic suture passer under direct visualization. The liver retractor was removed under direct visualization.  The abdomen was then desufflated and all remaining trochars removed. The skin incisions were closed with subcuticular Monocryl; benzoin, Steri-Strips and Band-Aids were applied The patient was then awakened, extubated and taken to PACU in stable condition.  All counts were correct at the completion of the case.

## 2022-05-22 ENCOUNTER — Encounter (HOSPITAL_COMMUNITY): Payer: Self-pay | Admitting: Surgery

## 2022-05-22 LAB — CBC
HCT: 41.1 % (ref 36.0–46.0)
Hemoglobin: 13.5 g/dL (ref 12.0–15.0)
MCH: 29.3 pg (ref 26.0–34.0)
MCHC: 32.8 g/dL (ref 30.0–36.0)
MCV: 89.3 fL (ref 80.0–100.0)
Platelets: 328 10*3/uL (ref 150–400)
RBC: 4.6 MIL/uL (ref 3.87–5.11)
RDW: 13.1 % (ref 11.5–15.5)
WBC: 10.5 10*3/uL (ref 4.0–10.5)
nRBC: 0 % (ref 0.0–0.2)

## 2022-05-22 LAB — COMPREHENSIVE METABOLIC PANEL
ALT: 99 U/L — ABNORMAL HIGH (ref 0–44)
AST: 84 U/L — ABNORMAL HIGH (ref 15–41)
Albumin: 3.5 g/dL (ref 3.5–5.0)
Alkaline Phosphatase: 79 U/L (ref 38–126)
Anion gap: 8 (ref 5–15)
BUN: 7 mg/dL (ref 6–20)
CO2: 25 mmol/L (ref 22–32)
Calcium: 8.4 mg/dL — ABNORMAL LOW (ref 8.9–10.3)
Chloride: 106 mmol/L (ref 98–111)
Creatinine, Ser: 0.77 mg/dL (ref 0.44–1.00)
GFR, Estimated: >60 mL/min (ref >60)
Glucose, Bld: 118 mg/dL — ABNORMAL HIGH (ref 70–99)
Potassium: 3.6 mmol/L (ref 3.5–5.1)
Sodium: 139 mmol/L (ref 135–145)
Total Bilirubin: 0.7 mg/dL (ref 0.3–1.2)
Total Protein: 7.1 g/dL (ref 6.5–8.1)

## 2022-05-22 LAB — SURGICAL PATHOLOGY

## 2022-05-22 LAB — MAGNESIUM: Magnesium: 2.2 mg/dL (ref 1.7–2.4)

## 2022-05-22 MED ORDER — DOCUSATE SODIUM 100 MG PO CAPS: 100.0000 mg | ORAL_CAPSULE | Freq: Two times a day (BID) | ORAL | 0 refills | Status: AC

## 2022-05-22 MED ORDER — ENOXAPARIN (LOVENOX) PATIENT EDUCATION KIT: 1.0000 | PACK | Freq: Once | 0 refills | Status: AC

## 2022-05-22 MED ORDER — TRAMADOL HCL 50 MG PO TABS: 50.0000 mg | ORAL_TABLET | Freq: Four times a day (QID) | ORAL | 0 refills | Status: DC | PRN

## 2022-05-22 MED ORDER — PANTOPRAZOLE SODIUM 40 MG PO TBEC: 40.0000 mg | DELAYED_RELEASE_TABLET | Freq: Every day | ORAL | 0 refills | Status: DC

## 2022-05-22 MED ORDER — ENOXAPARIN SODIUM 60 MG/0.6ML IJ SOSY: 60.0000 mg | PREFILLED_SYRINGE | Freq: Two times a day (BID) | INTRAMUSCULAR | 0 refills | Status: AC

## 2022-05-22 MED ORDER — ONDANSETRON 4 MG PO TBDP: 4.0000 mg | ORAL_TABLET | Freq: Four times a day (QID) | ORAL | 0 refills | Status: AC | PRN

## 2022-05-22 MED ORDER — ENOXAPARIN (LOVENOX) PATIENT EDUCATION KIT
PACK | Freq: Once | Status: DC
  Filled 2022-05-22: qty 1

## 2022-05-22 MED ORDER — ACETAMINOPHEN 500 MG PO TABS: 1000.0000 mg | ORAL_TABLET | Freq: Three times a day (TID) | ORAL | 0 refills | Status: AC

## 2022-05-22 MED ORDER — GABAPENTIN 100 MG PO CAPS: 200.0000 mg | ORAL_CAPSULE | Freq: Two times a day (BID) | ORAL | 0 refills | Status: DC

## 2022-05-22 MED ORDER — ENOXAPARIN SODIUM 60 MG/0.6ML IJ SOSY
60.0000 mg | PREFILLED_SYRINGE | Freq: Two times a day (BID) | INTRAMUSCULAR | Status: DC
  Administered 2022-05-23 (×2): 60 mg via SUBCUTANEOUS
  Filled 2022-05-22 (×2): qty 0.6

## 2022-05-22 NOTE — TOC Progression Note (Signed)
Transition of Care [facility]) - Progression Note    Patient Details  Name: Lori Clements MRN: [id number] Date of Birth: 1995/06/28  Transition of Care [facility]) CM/SW Contact  Beckie Busing, RN Phone Number:[phone]  05/22/2022, 11:32 AM  Clinical Narrative:     Transition of Care (TOC) Screening Note   Patient Details  Name: Lori Clements Date of Birth: 08/08/1995   Transition of Care [facility]) CM/SW Contact:    Beckie Busing, RN Phone Number: 05/22/2022, 11:33 AM    Transition of Care Department [facility]) has reviewed patient and no TOC needs have been identified at this time. We will continue to monitor patient advancement through interdisciplinary progression rounds. If new patient transition needs arise, please place a TOC consult.          Expected Discharge Plan and Services           Expected Discharge Date: 05/22/22                                     Social Determinants of Health (SDOH) Interventions    Readmission Risk Interventions     No data to display

## 2022-05-22 NOTE — Progress Notes (Signed)
S: Uneventful night, mild nausea this morning which has passed.  Tolerating water without issue.  She has walked in the halls multiple times.  O: Vitals, labs, intake/output, and orders reviewed at this time.  Afebrile, no tachycardia, mildly hypertensive.  P.o. intake-240, urine output-1801.  CMP-stable mild AST/ALT elevation, otherwise unremarkable.  WBC 10.5, hemoglobin 13.5 (14.1 preop), platelets 328   Gen: A&Ox3, no distress  H&N: EOMI, atraumatic, neck supple Chest: unlabored respirations, RRR Abd: soft, nontender, nondistended, incision(s) c/d/i no cellulitis or hematoma Ext: warm, no edema Neuro: grossly normal  Lines/tubes/drains: PIV  A/P: Postop day 1 status post laparoscopic sleeve gastrectomy -Continue clear liquids, protein shakes -SCDs while in bed, ambulate frequently, pulmonary toilet, chemical DVT prophylaxis -Possible discharge this afternoon if continuing to do well.  She will need to be discharged on prophylactic Lovenox.   Phylliss Blakes, MD [facility], [place]

## 2022-05-22 NOTE — Progress Notes (Signed)
Patient alert and oriented, pain is controlled. Patient is tolerating fluids, advanced to protein shake today, patient is tolerating well.  Reviewed Gastric sleeve discharge instructions with patient and patient is able to articulate understanding.  Provided information on BELT program, Support Group and [facility] outpatient pharmacy. All questions answered, will continue to monitor.  

## 2022-05-23 NOTE — Progress Notes (Signed)
Patient alert and oriented, Post op day 2.  Provided support and encouragement.  Encouraged pulmonary toilet, ambulation and small sips of liquids.  All questions answered.  Will continue to monitor.   Reviewed Lovenox teaching kit and pt reviewed Lovenox "Patient-Self Injection Video".    24hr fluid recall: 327m. Per dehydration protocol, will call pt to f/u within one week post-op.

## 2022-05-23 NOTE — Discharge Summary (Signed)
Physician Discharge Summary  Lori Clements [id number]:[id number] DOB: June 22, 1996 DOA: 05/21/2022  PCP: Almedia Balls, NP  Admit date: 05/21/2022 Discharge date: 05/23/2022   Recommendations for Outpatient Follow-up:    Follow-up Information     Clovis Riley, MD. Go on 06/14/2022.   Specialty: General Surgery Why: at 2:20pm. Please arrive 15 minutes prior to your appointment time. Thank you. Contact information: [address] [place] [address] [phone]         Clovis Riley, MD. Go on 07/11/2022.   Specialty: General Surgery Why: at 11:30am. Please arrive 15 minutes prior to your appointment time. Thank you. Contact information: [address] [place] [address] [phone]                Discharge Diagnoses:  Principal Problem:   Morbid obesity ([place])   Surgical Procedure: Laparoscopic Sleeve Gastrectomy, upper endoscopy  Discharge Condition: Good Disposition: Home  Diet recommendation: Postoperative sleeve gastrectomy diet (liquids only)  Filed Weights   05/21/22 0617 05/21/22 0704  Weight: (!) 153.2 kg (!) 153 kg     Hospital Course:  The patient was admitted for a planned laparoscopic sleeve gastrectomy. Please see operative note. Preoperatively the patient was given 5000 units of subcutaneous heparin for DVT prophylaxis. Postoperative prophylactic heparin dosing was started on the evening of postoperative day 0. ERAS protocol was used. On the evening of postoperative day 0, the patient was started on water and ice chips. On postoperative day 1 the patient had no fever or tachycardia and was tolerating water; their diet was gradually advanced throughout the day. The patient was ambulating without difficulty. Their vital signs are stable without fever or tachycardia. Their hemoglobin had remained stable. The patient was maintained on their home settings for CPAP therapy. She slowly completed the protein  shakes but had not met the threshold by the evening, and so remained inpatient an additional night. Her chemical dvt prophylaxis was change to lovenox on the evening of postop day 1. On postop day 2 she was tolerating liquids and protein shakes well and remained well. The patient had received discharge instructions and counseling. They were deemed stable for discharge and had met discharge criteria  Today's Vitals   05/22/22 1952 05/22/22 2149 05/23/22 0132 05/23/22 0537  BP: (!) 140/86 132/70 125/72 123/75  Pulse: 73 67 65 79  Resp: _0 Temp: 98.8 F (37.1 C) 98.5 F (36.9 C) (!) 97.4 F (36.3 C) 98.6 F (37 C)  TempSrc: Oral Oral Oral Oral  SpO2: 98% 96% 97% 98%  Weight:      Height:      PainSc:       Body mass index is 54.44 kg/m.   A&Ox3, no distress Unlabored respirations Abdomen soft, nontender, nondistended. Incisions c/d/I with steri strips, no cellulitis or hematoma.  No LE edema.  Discharge Instructions   Allergies as of 05/23/2022       Reactions   Naproxen    hematuria        Medication List     STOP taking these medications    amoxicillin 500 MG tablet Commonly known as: AMOXIL   metFORMIN 500 MG tablet Commonly known as: GLUCOPHAGE       TAKE these medications    acetaminophen 500 MG tablet Commonly known as: TYLENOL Take 2 tablets (1,000 mg total) by mouth every 8 (eight) hours for 5 days.   calcium carbonate 750 MG chewable tablet Commonly  known as: TUMS EX Chew 2 tablets by mouth 2 (two) times daily as needed for heartburn.   docusate sodium 100 MG capsule Commonly known as: Colace Take 1 capsule (100 mg total) by mouth 2 (two) times daily. Okay to decrease to once daily or stop taking if having loose bowel movements   enoxaparin 60 MG/0.6ML injection Commonly known as: LOVENOX Inject 0.6 mLs (60 mg total) into the skin every 12 (twelve) hours.   gabapentin 100 MG capsule Commonly known as: NEURONTIN Take 2 capsules  (200 mg total) by mouth every 12 (twelve) hours.   IMPLANON Combes Inject 1 each into the skin See admin instructions. Notes to patient: Medication may not be effective for the next 30 days.  Use precaution if necessary.     losartan 50 MG tablet Commonly known as: COZAAR Take 50 mg by mouth daily. Notes to patient: Monitor Blood Pressure Daily and keep a log for primary care physician.  You may need to make changes to your medications with rapid weight loss.      Melatonin 10 MG Tabs Take 10 mg by mouth at bedtime as needed (sleep).   ondansetron 4 MG disintegrating tablet Commonly known as: ZOFRAN-ODT Take 1 tablet (4 mg total) by mouth every 6 (six) hours as needed for nausea or vomiting.   pantoprazole 40 MG tablet Commonly known as: PROTONIX Take 1 tablet (40 mg total) by mouth daily.   sertraline 100 MG tablet Commonly known as: ZOLOFT Take 100 mg by mouth daily.   traMADol 50 MG tablet Commonly known as: ULTRAM Take 1 tablet (50 mg total) by mouth every 6 (six) hours as needed (pain).   traZODone 50 MG tablet Commonly known as: DESYREL Take 50 mg by mouth at bedtime.   Vitamin D 125 MCG (5000 UT) Caps Take 10,000 Units by mouth daily.       ASK your doctor about these medications    enoxaparin Kit Commonly known as: LOVENOX 1 kit by Does not apply route once for 1 dose. Ask about: Should I take this medication?        Follow-up Information     Clovis Riley, MD. Go on 06/14/2022.   Specialty: General Surgery Why: at 2:20pm. Please arrive 15 minutes prior to your appointment time. Thank you. Contact information: [address] [place] [address] [phone]         Clovis Riley, MD. Go on 07/11/2022.   Specialty: General Surgery Why: at 11:30am. Please arrive 15 minutes prior to your appointment time. Thank you. Contact information: [address] [place] [address] [phone]                   The results of significant diagnostics from this hospitalization (including imaging, microbiology, ancillary and laboratory) are listed below for reference.    Significant Diagnostic Studies: No results found.  Labs: Basic Metabolic Panel: Recent Labs  Lab 05/22/22 0500  NA 139  K 3.6  CL 106  CO2 25  GLUCOSE 118*  BUN 7  CREATININE 0.77  CALCIUM 8.4*  MG 2.2   Liver Function Tests: Recent Labs  Lab 05/22/22 0500  AST 84*  ALT 99*  ALKPHOS 79  BILITOT 0.7  PROT 7.1  ALBUMIN 3.5    CBC: Recent Labs  Lab 05/22/22 0500  WBC 10.5  HGB 13.5  HCT 41.1  MCV 89.3  PLT 328    CBG: Recent Labs  Lab  05/21/22 0626  GLUCAP 151*    Principal Problem:   Morbid obesity ([place])    Signed: Clovis Riley MD [facility], [place] 05/23/2022, 8:18 AM

## 2022-05-23 NOTE — Progress Notes (Signed)
Discharge instructions discussed with patient and family, verbalized agreement and understanding 

## 2022-05-24 ENCOUNTER — Telehealth (HOSPITAL_COMMUNITY): Payer: Self-pay | Admitting: *Deleted

## 2022-05-24 NOTE — Telephone Encounter (Signed)
1.  Tell me about your pain and pain management? Pt denies any pain, just states that she is sore. Pt states that she has been experiencing some intermittent abdominal cramping while drinking that lasts for a few seconds and then "goes away".  Pt states that "you can just feel it going down".  Pt can tolerate protein shakes and water.  Pt instructed to call [facility] if pain worsens.  2.  Let's talk about fluid intake.  How much total fluid are you taking in? Today is patient's first full day at home.  We discussed the plan for fluids today.  She has a 32oz water bottle that she is drinking and plans to consume two 30g protein shakes.  Pt encouraged to continue to work towards meeting goal.  Pt instructed to assess status and suggestions daily utilizing Hydration Action Plan on discharge folder and to call [facility] if in the "red zone".   3.  How much protein have you taken in the last 2 days? Pt states that she is working to meet goal of goal of 60g of protein today.  Pt has plans to drink two protein shakes.  We discussed the importance of meeting protein goal daily.  4.  Have you had nausea?  Tell me about when have experienced nausea and what you did to help? Pt denies nausea, bu states that she has been taking the medication at least every 8 hours.   5.  Has the frequency or color changed with your urine? Pt states that she is urinating "fine" with no changes in frequency or urgency.     6.  Tell me what your incisions look like? "Incisions look fine". Pt denies a fever, chills.  Pt states incisions are not swollen, open, or draining.  Pt encouraged to call [facility] if incisions change.   7.  Have you been passing gas? BM? Pt states that she has not had a BM.  Pt instructed to take either Miralax or MoM as instructed per "Gastric Bypass/Sleeve Discharge Home Care Instructions".  Pt to call surgeon's office if not able to have BM with medication.   8.  If a problem or question were to arise who would you  call?  Do you know contact numbers for [facility], [facility], and [facility]? Pt denies dehydration symptoms.  Pt can describe s/sx of dehydration.  Pt knows to call [facility] for surgical, [facility] for nutrition, and [facility] for non-urgent questions or concerns.   9.  How has the walking going? Pt states she is walking around and able to be active without difficulty.   10. Are you still using your incentive spirometer?  If so, how often? Pt states that she is doing the I.S. and "getting at the way to the top." Pt encouraged to use incentive spirometer, at least 10x every hour while awake until she sees the surgeon.  11.  How are your vitamins and calcium going?  How are you taking them? Pt states that she will begin the supplements tomorrow.  Reinforced education about taking supplements at least two hours apart.  LOVENOX: Pt states that she is taking the Lovenox injections without difficulty.  Reinforced education about proper hand hygiene, taking injections q12h and rotating injection sites.  Pt also instructed to monitor for unusual bruising and/or signs of bleeding.  Reminded patient that the first 30 days post-operatively are important for successful recovery.  Practice good hand hygiene, wearing a mask when appropriate (since optional in most places), and minimizing exposure  to people who live outside of the home, especially if they are exhibiting any respiratory, GI, or illness-like symptoms.

## 2022-06-04 ENCOUNTER — Ambulatory Visit: Payer: BC Managed Care – PPO

## 2022-06-06 ENCOUNTER — Encounter: Payer: Self-pay | Admitting: Dietician

## 2022-06-06 ENCOUNTER — Encounter: Payer: BC Managed Care – PPO | Attending: Family Medicine | Admitting: Dietician

## 2022-06-06 DIAGNOSIS — E669 Obesity, unspecified: Secondary | ICD-10-CM | POA: Diagnosis not present

## 2022-06-06 NOTE — Progress Notes (Signed)
2 Week Post-Operative Nutrition Class   Patient was seen on 06/06/2022 for Post-Operative Nutrition education at the [facility].   Virtual Visit via Video Note  I connected with Lori Clements on 06/06/22 at 11:00 AM EST by a video enabled telemedicine application and verified that I am speaking with the correct person using two identifiers. I discussed the limitations of this type of visit. The patient expressed understanding and agreed to proceed.  Surgery date: 05/21/2022 Surgery type: sleeve  Anthropometrics  Start weight at [facility]: 340.8 lbs (date: 11/06/2021)  Height: 66 in Weight today: virtual BMI:  kg/m2     Clinical  Medical hx: IBS, HTN, Obesity, Sleep Apnea, T2DM Medications: Trazodone, Famotindine, Losartan Poassium, Sertraline  Labs: 08/21/2021:  A1C 6.8; HDL 35; Vit D 30.8; glucose 135;  Notable signs/symptoms: Any previous deficiencies? No Bowel Habits: Every day to every other day no complaints     The following the learning objectives were met by the patient during this course: Identifies Phase 3 (Soft, High Proteins) Dietary Goals and will begin from 2 weeks post-operatively to 2 months post-operatively Identifies appropriate sources of fluids and proteins  Identifies appropriate fat sources and healthy verses unhealthy fat types   States protein recommendations and appropriate sources post-operatively Identifies the need for appropriate texture modifications, mastication, and bite sizes when consuming solids Identifies appropriate fat consumption and sources Identifies appropriate multivitamin and calcium sources post-operatively Describes the need for physical activity post-operatively and will follow MD recommendations States when to call healthcare provider regarding medication questions or post-operative complications   Handouts given during class include: Phase 3A: Soft, High Protein Diet Handout Phase 3 High Protein  Meals Healthy Fats   Follow-Up Plan: Patient will follow-up at [facility] in 6 weeks for 2 month post-op nutrition visit for diet advancement per MD.

## 2022-07-02 ENCOUNTER — Ambulatory Visit: Payer: BC Managed Care – PPO | Admitting: Dietician

## 2022-10-22 DIAGNOSIS — K921 Melena: Secondary | ICD-10-CM | POA: Diagnosis not present

## 2022-10-22 DIAGNOSIS — E1165 Type 2 diabetes mellitus with hyperglycemia: Secondary | ICD-10-CM | POA: Diagnosis not present

## 2022-10-22 DIAGNOSIS — Z0000 Encounter for general adult medical examination without abnormal findings: Secondary | ICD-10-CM | POA: Diagnosis not present

## 2022-10-22 DIAGNOSIS — I10 Essential (primary) hypertension: Secondary | ICD-10-CM | POA: Diagnosis not present

## 2022-10-22 DIAGNOSIS — L02419 Cutaneous abscess of limb, unspecified: Secondary | ICD-10-CM | POA: Diagnosis not present

## 2022-11-06 DIAGNOSIS — G4733 Obstructive sleep apnea (adult) (pediatric): Secondary | ICD-10-CM | POA: Diagnosis not present

## 2022-12-06 DIAGNOSIS — G479 Sleep disorder, unspecified: Secondary | ICD-10-CM | POA: Diagnosis not present

## 2022-12-06 DIAGNOSIS — G4733 Obstructive sleep apnea (adult) (pediatric): Secondary | ICD-10-CM | POA: Diagnosis not present

## 2022-12-06 DIAGNOSIS — F411 Generalized anxiety disorder: Secondary | ICD-10-CM | POA: Diagnosis not present

## 2022-12-23 ENCOUNTER — Other Ambulatory Visit: Payer: Self-pay | Admitting: Nurse Practitioner

## 2022-12-23 ENCOUNTER — Ambulatory Visit (HOSPITAL_COMMUNITY)
Admission: EM | Admit: 2022-12-23 | Discharge: 2022-12-23 | Disposition: A | Discharge status: Home / Self Care | Payer: BC Managed Care – PPO | Attending: Urgent Care | Admitting: Urgent Care

## 2022-12-23 ENCOUNTER — Encounter (HOSPITAL_COMMUNITY): Payer: Self-pay

## 2022-12-23 ENCOUNTER — Other Ambulatory Visit (HOSPITAL_COMMUNITY)
Admission: RE | Admit: 2022-12-23 | Discharge: 2022-12-23 | Disposition: A | Discharge status: Home / Self Care | Payer: BC Managed Care – PPO | Source: Ambulatory Visit | Attending: Nurse Practitioner | Admitting: Nurse Practitioner

## 2022-12-23 DIAGNOSIS — I891 Lymphangitis: Secondary | ICD-10-CM | POA: Diagnosis not present

## 2022-12-23 DIAGNOSIS — Z3046 Encounter for surveillance of implantable subdermal contraceptive: Secondary | ICD-10-CM | POA: Diagnosis not present

## 2022-12-23 DIAGNOSIS — Z23 Encounter for immunization: Secondary | ICD-10-CM

## 2022-12-23 DIAGNOSIS — Z01419 Encounter for gynecological examination (general) (routine) without abnormal findings: Secondary | ICD-10-CM | POA: Diagnosis not present

## 2022-12-23 DIAGNOSIS — L03116 Cellulitis of left lower limb: Secondary | ICD-10-CM | POA: Diagnosis not present

## 2022-12-23 DIAGNOSIS — S91332A Puncture wound without foreign body, left foot, initial encounter: Secondary | ICD-10-CM

## 2022-12-23 DIAGNOSIS — N870 Mild cervical dysplasia: Secondary | ICD-10-CM | POA: Insufficient documentation

## 2022-12-23 MED ORDER — TETANUS-DIPHTH-ACELL PERTUSSIS 5-2.5-18.5 LF-MCG/0.5 IM SUSY
0.5000 mL | PREFILLED_SYRINGE | Freq: Once | INTRAMUSCULAR | Status: AC
  Administered 2022-12-23: 0.5 mL via INTRAMUSCULAR

## 2022-12-23 MED ORDER — LIDOCAINE HCL (PF) 1 % IJ SOLN
INTRAMUSCULAR | Status: AC
  Filled 2022-12-23: qty 2

## 2022-12-23 MED ORDER — TETANUS-DIPHTH-ACELL PERTUSSIS 5-2.5-18.5 LF-MCG/0.5 IM SUSY
PREFILLED_SYRINGE | INTRAMUSCULAR | Status: AC
  Filled 2022-12-23: qty 0.5

## 2022-12-23 MED ORDER — CEFTRIAXONE SODIUM 1 G IJ SOLR
INTRAMUSCULAR | Status: AC
  Filled 2022-12-23: qty 10

## 2022-12-23 MED ORDER — MUPIROCIN 2 % EX OINT: 1.0000 | TOPICAL_OINTMENT | Freq: Three times a day (TID) | CUTANEOUS | 0 refills | Status: AC

## 2022-12-23 MED ORDER — DICLOFENAC SODIUM 75 MG PO TBEC: 75.0000 mg | DELAYED_RELEASE_TABLET | Freq: Two times a day (BID) | ORAL | 0 refills | Status: AC | PRN

## 2022-12-23 MED ORDER — AMOXICILLIN-POT CLAVULANATE 875-125 MG PO TABS: 1.0000 | ORAL_TABLET | Freq: Two times a day (BID) | ORAL | 0 refills | Status: AC

## 2022-12-23 MED ORDER — CEFTRIAXONE SODIUM 1 G IJ SOLR
1.0000 g | Freq: Once | INTRAMUSCULAR | Status: AC
  Administered 2022-12-23: 1 g via INTRAMUSCULAR

## 2022-12-23 NOTE — ED Triage Notes (Signed)
Pt c/o bite by her dog last night to lt foot with 2 puncture wounds. States her dogs are up to date on vaccines and her Tetanus is up dated. States pain on walking or standing.

## 2022-12-23 NOTE — ED Provider Notes (Addendum)
[facility]    CSN: [id number] Arrival date & time: 12/23/22  1624      History   Chief Complaint Chief Complaint  Patient presents with   Animal Bite    HPI Lori Clements is a 27 y.o. female.   Pleasant 27 year old female with a known history of diabetes presents today due to concerns of an infection in her foot.  Yesterday she was at a birthday party at her house where her dog started going berserk at one of the new toys.  States 2 of her dogs got in a fight, and she tried to break them up with her left foot.  One of the dogs inadvertently bit her lateral L foot just proximal to the 4th and 5th digit.  She reports significant pain and swelling since the injury, and difficulty bearing weight.  She has been using crutches for ambulation.  She denies a fever or systemic symptoms.  Tried to clean it out with alcohol yesterday.  Does not know date of her last tetanus.   Animal Bite   Past Medical History:  Diagnosis Date   Anxiety    Back pain    Depression    Diabetes mellitus without complication (HCC) 08/21/2021   08/21/21 A1c 6.8%   Elevated liver function tests 08/16/2021   Essential hypertension    GERD (gastroesophageal reflux disease)    IBS (irritable bowel syndrome)    Other fatigue    Shortness of breath on exertion    Sleep apnea    Thyroid disease     Patient Active Problem List   Diagnosis Date Noted   Morbid obesity (HCC) 05/21/2022   Morbid obesity with body mass index (BMI) of 50.0 to 59.9 in adult [facility]) 02/24/2022   Atypical endocervical cells on cervical Papanicolaou smear 02/24/2022   High risk HPV infection 02/24/2022   Genetic testing 02/20/2022    Past Surgical History:  Procedure Laterality Date   CERVICAL CONIZATION W/BX N/A 04/03/2022   Procedure: CONIZATION CERVIX WITH BIOPSY;  Surgeon: Steva Ready, DO;  Location: [facility];  Service: Gynecology;  Laterality: N/A;   COLPOSCOPY N/A 03/13/2022   Procedure: COLPOSCOPY WITH  ENDOCERVICAL CURETTAGE;  Surgeon: Steva Ready, DO;  Location: [facility];  Service: Gynecology;  Laterality: N/A;   DILATATION & CURETTAGE/HYSTEROSCOPY WITH MYOSURE N/A 03/13/2022   Procedure: DILATATION & CURETTAGE/HYSTEROSCOPY WITH MYOSURE;  Surgeon: Steva Ready, DO;  Location: [facility];  Service: Gynecology;  Laterality: N/A;   LAPAROSCOPIC GASTRIC SLEEVE RESECTION N/A 05/21/2022   Procedure: LAPAROSCOPIC SLEEVE GASTRECTOMY;  Surgeon: Berna Bue, MD;  Location: [facility];  Service: General;  Laterality: N/A;   UPPER GI ENDOSCOPY N/A 05/21/2022   Procedure: UPPER GI ENDOSCOPY;  Surgeon: Berna Bue, MD;  Location: [facility];  Service: General;  Laterality: N/A;   WISDOM TOOTH EXTRACTION N/A 2013    OB History     Gravida  0   Para  0   Term  0   Preterm  0   AB  0   Living  0      SAB  0   IAB  0   Ectopic  0   Multiple  0   Live Births  0            Home Medications    Prior to Admission medications   Medication Sig Start Date End Date Taking? Authorizing Provider  amoxicillin-clavulanate (AUGMENTIN) 875-125 MG tablet Take 1 tablet by mouth every 12 (twelve)  hours. 12/23/22  Yes Anterio Scheel L, PA  diclofenac (VOLTAREN) 75 MG EC tablet Take 1 tablet (75 mg total) by mouth 2 (two) times daily as needed for moderate pain. Take with food. 12/23/22  Yes Jiya Kissinger L, PA  mupirocin ointment (BACTROBAN) 2 % Apply 1 Application topically 3 (three) times daily. 12/23/22  Yes Clarivel Callaway L, PA  calcium carbonate (TUMS EX) 750 MG chewable tablet Chew 2 tablets by mouth 2 (two) times daily as needed for heartburn.    [provider]  Cholecalciferol (VITAMIN D) 125 MCG (5000 UT) CAPS Take 10,000 Units by mouth daily.    [provider]  Etonogestrel (IMPLANON [place]) Inject 1 each into the skin See admin instructions.    [provider]  losartan (COZAAR) 50 MG tablet Take 50 mg by mouth daily.    [provider]  Melatonin 10 MG  TABS Take 10 mg by mouth at bedtime as needed (sleep).    [provider]  ondansetron (ZOFRAN-ODT) 4 MG disintegrating tablet Take 1 tablet (4 mg total) by mouth every 6 (six) hours as needed for nausea or vomiting. 05/22/22   Berna Bue, MD  sertraline (ZOLOFT) 100 MG tablet Take 100 mg by mouth daily. 08/18/21   [provider]  traZODone (DESYREL) 50 MG tablet Take 50 mg by mouth at bedtime. 07/02/21   [provider]    Family History Family History  Problem Relation Age of Onset   Obesity Mother    Drug abuse Mother    Schizophrenia Mother    Bipolar disorder Mother    Anxiety disorder Mother    Depression Mother    Cancer Mother        ovarian   Drug abuse Father    Schizophrenia Father    Bipolar disorder Father    Depression Other    Anxiety disorder Other    Bipolar disorder Other    Schizophrenia Other    Drug abuse Other    Obesity Other    Breast cancer Other    Ovarian cancer Half-Sister        dx 52s    Social History Social History   Tobacco Use   Smoking status: Never   Smokeless tobacco: Never  Vaping Use   Vaping Use: Never used  Substance Use Topics   Alcohol use: No    Comment: occasional   Drug use: No     Allergies   Naproxen   Review of Systems Review of Systems As per HPI  Physical Exam Triage Vital Signs ED Triage Vitals [12/23/22 1740]  Enc Vitals Group     BP 128/87     Pulse Rate 81     Resp 18     Temp 99.7 F (37.6 C)     Temp Source Oral     SpO2 96 %     Weight      Height      Head Circumference      Peak Flow      Pain Score 6     Pain Loc      Pain Edu?      Excl. in GC?    No data found.  Updated Vital Signs BP 128/87 (BP Location: Left Arm)   Pulse 81   Temp 99.7 F (37.6 C) (Oral)   Resp 18   LMP 12/11/2022   SpO2 96%   Visual Acuity Right Eye Distance:   Left Eye Distance:  Bilateral Distance:    Right Eye Near:   Left Eye Near:    Bilateral Near:      Physical Exam Vitals and nursing note reviewed.  Constitutional:      General: She is not in acute distress.    Appearance: Normal appearance. She is not ill-appearing, toxic-appearing or diaphoretic.  HENT:     Head: Normocephalic and atraumatic.  Cardiovascular:     Rate and Rhythm: Normal rate.     Pulses: Normal pulses.  Pulmonary:     Effort: Pulmonary effort is normal. No respiratory distress.  Musculoskeletal:        General: Swelling, tenderness and signs of injury (two puncture wounds with surrounding edema and erythema to L lateral foot) present. Normal range of motion.       Feet:  Feet:     Left foot:     Skin integrity: Erythema and warmth present.     Comments: Scant serous discharge Skin:    General: Skin is warm and dry.     Findings: Erythema present.  Neurological:     General: No focal deficit present.     Mental Status: She is alert and oriented to person, place, and time.     Sensory: No sensory deficit.     Motor: No weakness.     Gait: Gait abnormal (due to pain, pt using crutches).      UC Treatments / Results  Labs (all labs ordered are listed, but only abnormal results are displayed) Labs Reviewed - No data to display  EKG   Radiology No results found.  Procedures Procedures (including critical care time)  Medications Ordered in UC Medications  Tdap (BOOSTRIX) injection 0.5 mL (0.5 mLs Intramuscular Given 12/23/22 1809)  cefTRIAXone (ROCEPHIN) injection 1 g (1 g Intramuscular Given 12/23/22 1808)    Initial Impression / Assessment and Plan / UC Course  I have reviewed the triage vital signs and the nursing notes.  Pertinent labs & imaging results that were available during my care of the patient were reviewed by me and considered in my medical decision making (see chart for details).     Puncture wound L foot - due to dog. Pt's dog is UTD on all vaccinations. Puncture wound is visibly infected, still open. Will have pt soak foot in  warm epsom salt water and apply topical mupirocin ointment. PRN diclofenac for pain/ swelling. Pt has allergy to naproxen listed in chart - pt states she "peed blood as a kid on naproxen." Doesn't have any other NSAID intolerances. Tdap - updated in office today. Cellulitis - cephtriaxone given in office given pt's hx of DM and developing lymphangitis. VSS. Will also DC home on PO Augmentin BID x 7-10 days. Lymphangitis - line was drawn around the 7cm extension up foot. Pt was instructed that should the erythema extend past this line, she should RTC Or head to ER.   Final Clinical Impressions(s) / UC Diagnoses   Final diagnoses:  Puncture wound of left foot, initial encounter  Need for prophylactic vaccination with combined diphtheria-tetanus-pertussis (DTP) vaccine  Cellulitis of left lower extremity  Lymphangitis of lower extremity     Discharge Instructions      You have an infected puncture wound on your foot. Because the wound is still open, please soak this in warm Epsom salt water several times daily to help leach out the infection. Apply topical antibiotic ointment prescribed today over the puncture wounds after each soak.  Take the Augmentin twice  daily with food to help get rid of the infection. If the red line extending up your foot goes into your leg in the next 24 hours, or if you develop a fever, please head to the emergency room.  I have called in diclofenac for you, a potent anti-inflammatory medication. Take this up to twice daily with food as needed for severe pain or swelling. Do not take any additional over-the-counter anti-inflammatory such as Motrin, Advil, Aleve, naproxen.    ED Prescriptions     Medication Sig Dispense Auth. Provider   amoxicillin-clavulanate (AUGMENTIN) 875-125 MG tablet Take 1 tablet by mouth every 12 (twelve) hours. 20 tablet Ambermarie Honeyman L, PA   diclofenac (VOLTAREN) 75 MG EC tablet Take 1 tablet (75 mg total) by mouth 2 (two) times  daily as needed for moderate pain. Take with food. 10 tablet Monterius Rolf L, PA   mupirocin ointment (BACTROBAN) 2 % Apply 1 Application topically 3 (three) times daily. 22 g Peyton Spengler L, [place]      PDMP not reviewed this encounter.   Maretta Bees, [place] 12/23/22 1840    Maretta Bees, [place] 12/23/22 [phone]

## 2022-12-23 NOTE — Discharge Instructions (Addendum)
You have an infected puncture wound on your foot. Because the wound is still open, please soak this in warm Epsom salt water several times daily to help leach out the infection. Apply topical antibiotic ointment prescribed today over the puncture wounds after each soak.  Take the Augmentin twice daily with food to help get rid of the infection. If the red line extending up your foot goes into your leg in the next 24 hours, or if you develop a fever, please head to the emergency room.  I have called in diclofenac for you, a potent anti-inflammatory medication. Take this up to twice daily with food as needed for severe pain or swelling. Do not take any additional over-the-counter anti-inflammatory such as Motrin, Advil, Aleve, naproxen.

## 2022-12-25 LAB — CYTOLOGY - PAP
Adequacy: ABSENT
Comment: NEGATIVE
Diagnosis: UNDETERMINED — AB
High risk HPV: NEGATIVE

## 2023-01-25 IMAGING — RF DG UGI W/ HIGH DENSITY W/O KUB
13 of 15 series · 15 of 24 positions shown · non-contrast
Comparison: NONE.

CLINICAL DATA: Morbid obesity.  Preop for bariatric surgery.

EXAM:
UPPER GI SERIES WITH HIGH DENSITY WITHOUT KUB
TECHNIQUE: Scout radiograph was obtained. Combined double and single contrast
examination was performed using effervescent crystals, high-density
barium and thin liquid barium. This exam was performed by Jigar
Erxleben, Ferienhaus, and was supervised and interpreted by Victorio, Shira.
FLUOROSCOPY TIME:  Radiation Exposure Index (as provided by the
fluoroscopic device): [id number]

[Series 3: t abdomen · 1 of 1 slices shown]
[im 1/1]
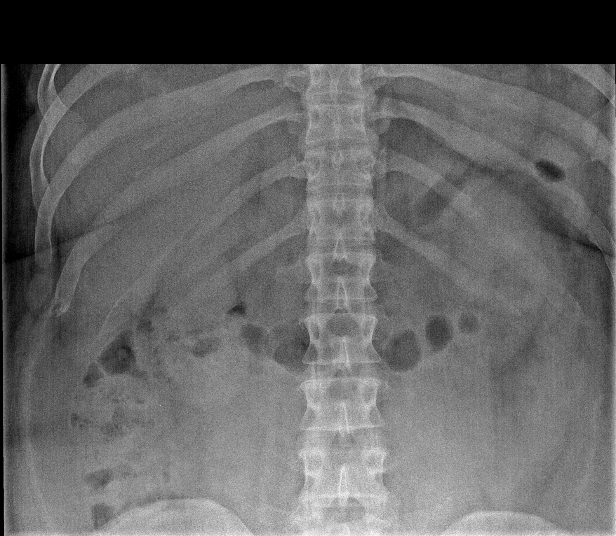

[Series 5: fluoro_barium 2fps_bw · 1 of 2 frames shown (1 of 8)]
[frame 2/2]
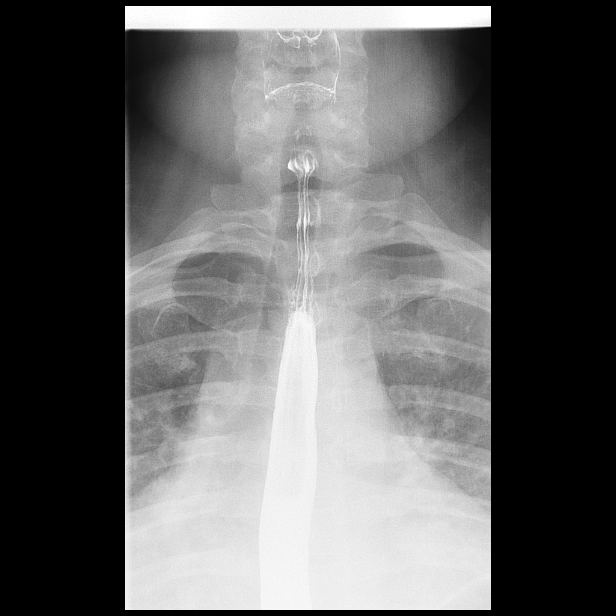

[Series 7: fluoro_barium 2fps_bw · 1 of 5 frames shown (2 of 8)]
[frame 5/5]
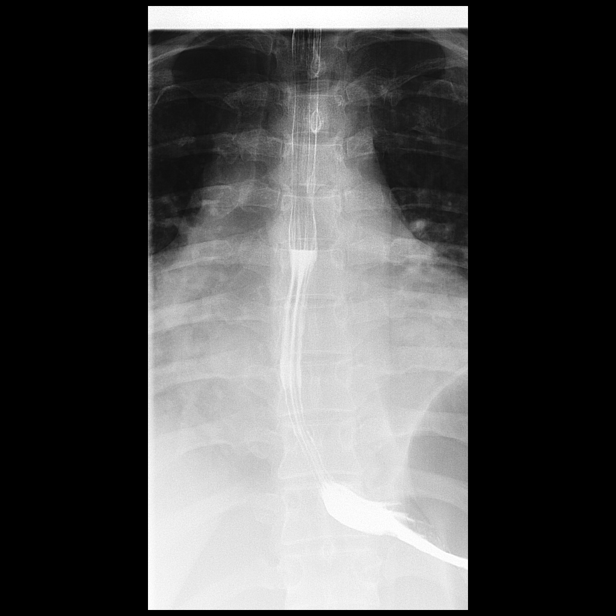

[Series 8: fluoro_barium 2fps_bw · 1 of 2 frames shown (3 of 8)]
[frame 1/2]
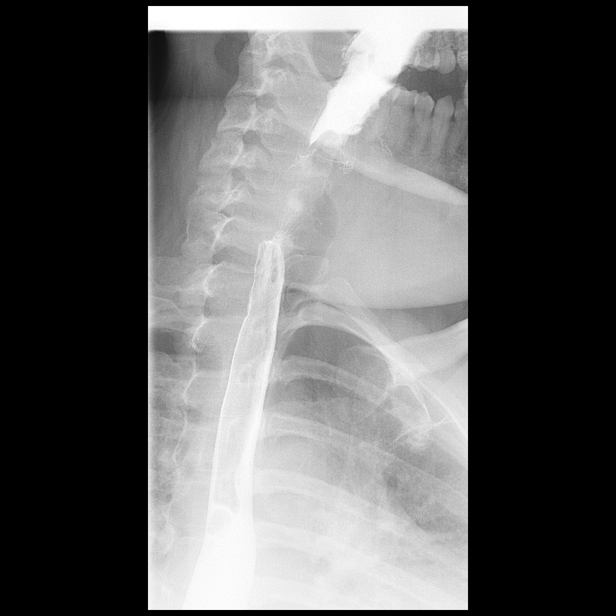

[Series 9: fluoro_barium 2fps_bw · 2 of 7 frames shown (4 of 8)]
[frame 6/7]
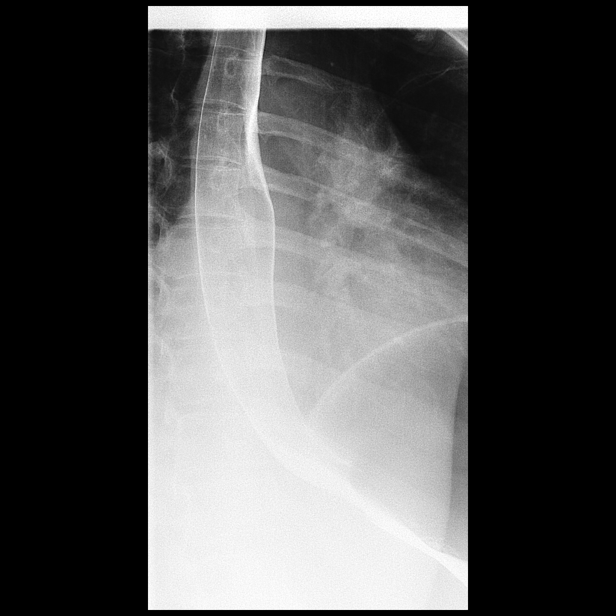
[frame 7/7]
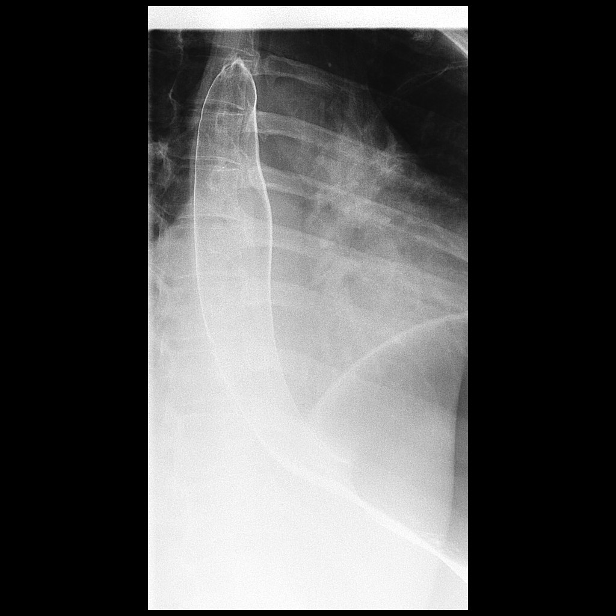

[Series 13: fluoro_barium 2fps_bw · 1 of 1 slices shown (5 of 8)]
[im 1/1]
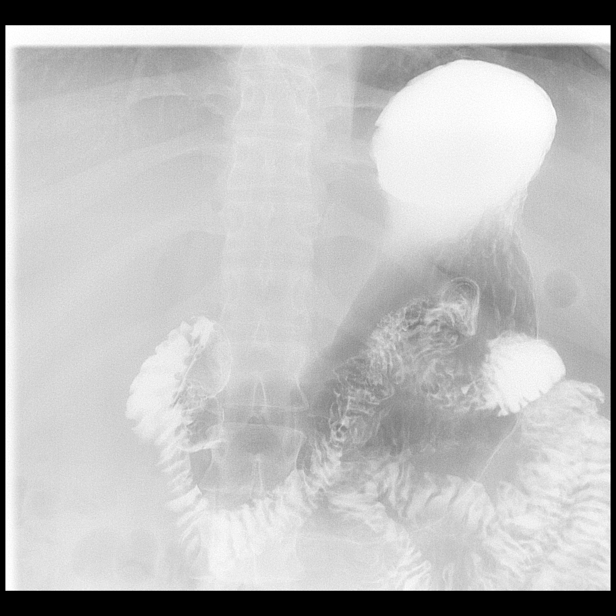

[Series 16: fluoro_barium 2fps_bw · 1 of 1 slices shown (6 of 8)]
[im 1/1]
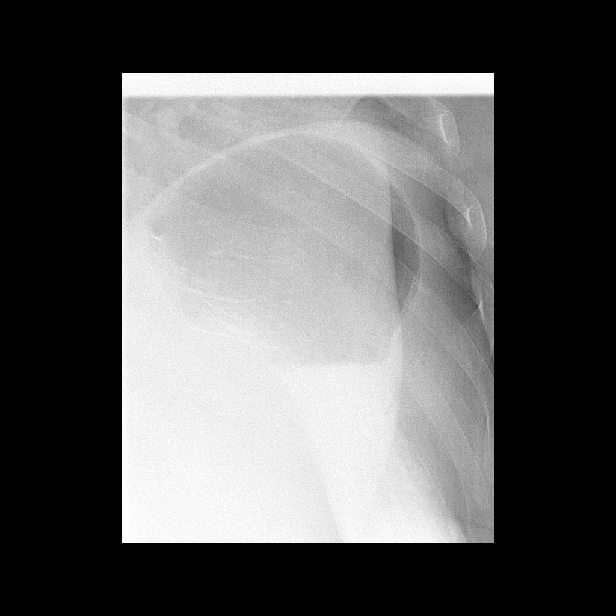

[Series 17: fluoro_barium 2fps_bw · 1 of 1 slices shown (7 of 8)]
[im 1/1]
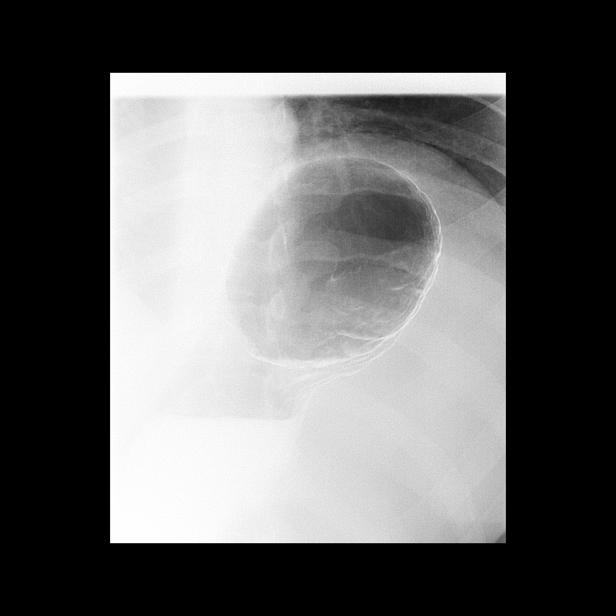

[Series 18: cp_standard · 1 of 97 frames shown (1 of 4)]
[frame 91/97]
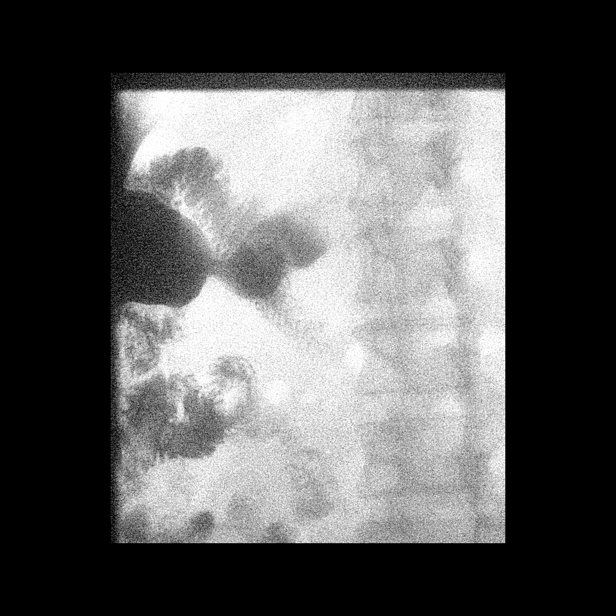

[Series 19: cp_standard · 1 of 132 frames shown (2 of 4)]
[frame 20/132]
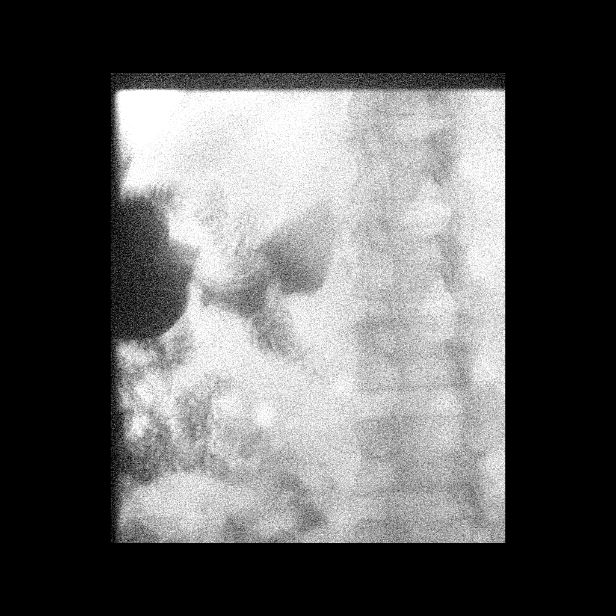

[Series 20: cp_standard · 2 of 191 frames shown (3 of 4)]
[frame 29/191]
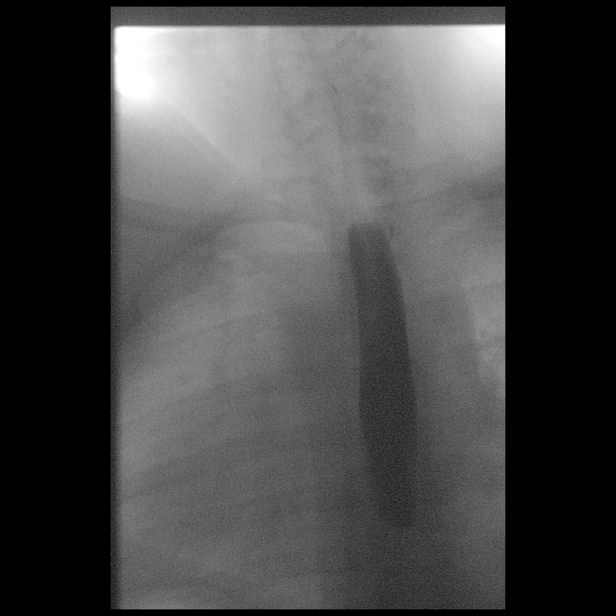
[frame 163/191]
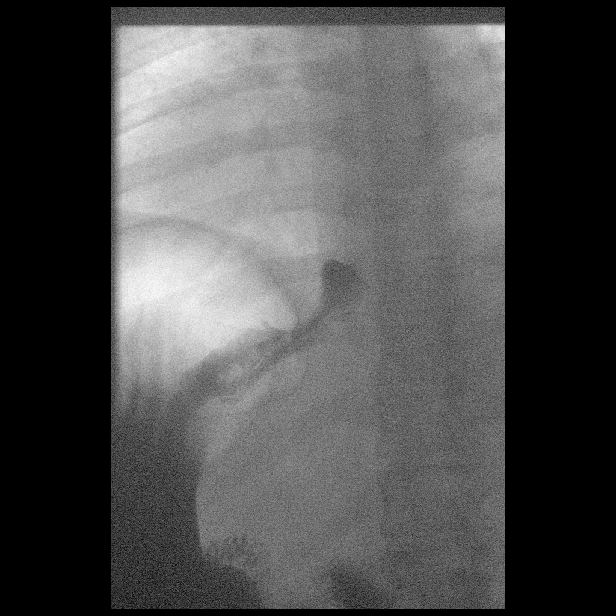

[Series 21: cp_standard · 1 of 174 frames shown (4 of 4)]
[frame 27/174]
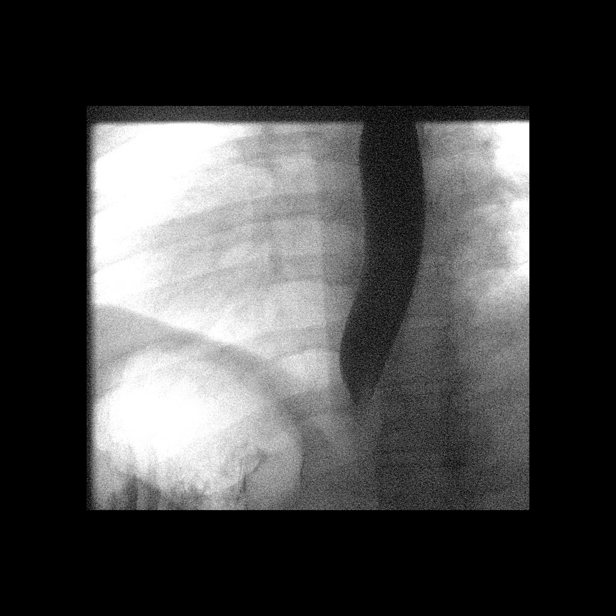

[Series 22: fluoro_barium 2fps_bw · 1 of 1 slices shown (8 of 8)]
[im 1/1]
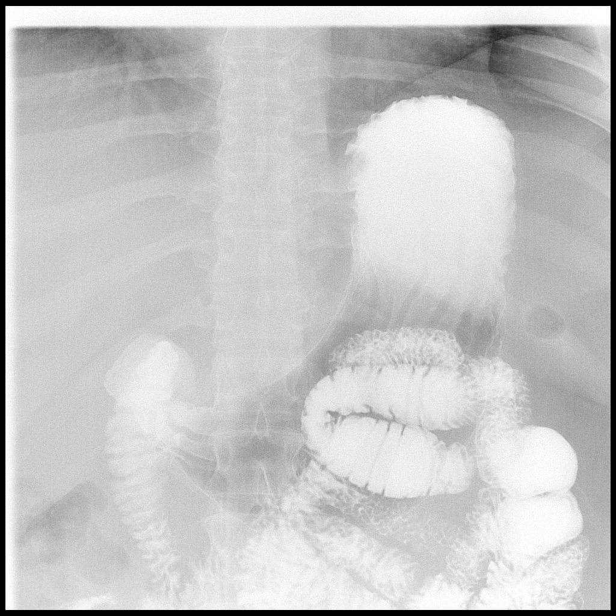

[15 of 24 positions shown; findings below may reference images not displayed]

FINDINGS: Scout Radiograph:  Nonobstructive bowel gas pattern

Esophagus: Unremarkable course, caliber, and mucosa.

Esophageal motility: Within normal limits.

Gastroesophageal reflux: None visualized.

Ingested 13mm barium tablet: Not given

Stomach: Normal appearance. No hiatal hernia.

Gastric emptying: Normal.

Duodenum: Normal appearance.

Other:  None.
IMPRESSION: Unremarkable upper GI series.

## 2023-02-06 DIAGNOSIS — G4733 Obstructive sleep apnea (adult) (pediatric): Secondary | ICD-10-CM | POA: Diagnosis not present

## 2023-03-27 ENCOUNTER — Encounter (HOSPITAL_COMMUNITY): Payer: Self-pay | Admitting: *Deleted

## 2023-04-04 DIAGNOSIS — R5383 Other fatigue: Secondary | ICD-10-CM | POA: Diagnosis not present

## 2023-04-04 DIAGNOSIS — Z1321 Encounter for screening for nutritional disorder: Secondary | ICD-10-CM | POA: Diagnosis not present

## 2023-04-04 DIAGNOSIS — Z903 Acquired absence of stomach [part of]: Secondary | ICD-10-CM | POA: Diagnosis not present

## 2023-04-04 DIAGNOSIS — E569 Vitamin deficiency, unspecified: Secondary | ICD-10-CM | POA: Diagnosis not present

## 2023-04-14 ENCOUNTER — Encounter: Payer: Self-pay | Admitting: Dermatology

## 2023-04-14 ENCOUNTER — Ambulatory Visit: Payer: BC Managed Care – PPO | Admitting: Dermatology

## 2023-04-14 DIAGNOSIS — L732 Hidradenitis suppurativa: Secondary | ICD-10-CM

## 2023-04-14 MED ORDER — DOXYCYCLINE HYCLATE 100 MG PO CAPS: ORAL_CAPSULE | ORAL | 5 refills | Status: AC

## 2023-04-14 MED ORDER — CLINDAMYCIN PHOSPHATE 1 % EX LOTN: TOPICAL_LOTION | Freq: Every day | CUTANEOUS | 5 refills | Status: AC

## 2023-04-14 NOTE — Patient Instructions (Addendum)
Follow up with Dr. Onalee Hua for HS   Hidradenitis Suppurativa is a chronic; persistent; non-curable, but treatable condition due to abnormal inflamed sweat glands in the body folds (axilla, inframammary, groin, medial thighs), causing recurrent painful draining cysts and scarring. It can be associated with severe scarring acne and cysts; also abscesses and scarring of scalp. The goal is control and prevention of flares, as it is not curable. Scars are permanent and can be thickened. Treatment may include daily use of topical medication and oral antibiotics.  Oral isotretinoin may also be helpful.  For some cases, Humira or Cosentyx (biologic injections) may be prescribed to decrease the inflammatory process and prevent flares.  When indicated, inflamed cysts may also be treated surgically.  Important Information   Due to recent changes in healthcare laws, you may see results of your pathology and/or laboratory studies on MyChart before the doctors have had a chance to review them. We understand that in some cases there may be results that are confusing or concerning to you. Please understand that not all results are received at the same time and often the doctors may need to interpret multiple results in order to provide you with the best plan of care or course of treatment. Therefore, we ask that you please give [place] 2 business days to thoroughly review all your results before contacting the office for clarification. Should we see a critical lab result, you will be contacted sooner.     If You Need Anything After Your Visit   If you have any questions or concerns for your doctor, please call our main line at [phone]. If no one answers, please leave a voicemail as directed and we will return your call as soon as possible. Messages left after 4 pm will be answered the following business day.    You may also send [place] a message via MyChart. We typically respond to MyChart messages within 1-2 business  days.  For prescription refills, please ask your pharmacy to contact our office. Our fax number is [phone].  If you have an urgent issue when the clinic is closed that cannot wait until the next business day, you can page your doctor at the number below.     Please note that while we do our best to be available for urgent issues outside of office hours, we are not available 24/7.    If you have an urgent issue and are unable to reach [place], you may choose to seek medical care at your doctor's office, retail clinic, urgent care center, or emergency room.   If you have a medical emergency, please immediately call 911 or go to the emergency department. In the event of inclement weather, please call our main line at [phone] for an update on the status of any delays or closures.  Dermatology Medication Tips: Please keep the boxes that topical medications come in in order to help keep track of the instructions about where and how to use these. Pharmacies typically print the medication instructions only on the boxes and not directly on the medication tubes.   If your medication is too expensive, please contact our office at [phone] or send [place] a message through MyChart.    We are unable to tell what your co-pay for medications will be in advance as this is different depending on your insurance coverage. However, we may be able to find a substitute medication at lower cost or fill out paperwork to get insurance to cover a needed medication.  If a prior authorization is required to get your medication covered by your insurance company, please allow [place] 1-2 business days to complete this process.   Drug prices often vary depending on where the prescription is filled and some pharmacies may offer cheaper prices.   The website www.goodrx.com contains coupons for medications through different pharmacies. The prices here do not account for what the cost may be with help from insurance (it may  be cheaper with your insurance), but the website can give you the price if you did not use any insurance.  - You can print the associated coupon and take it with your prescription to the pharmacy.  - You may also stop by our office during regular business hours and pick up a GoodRx coupon card.  - If you need your prescription sent electronically to a different pharmacy, notify our office through [facility] or by phone at [phone]

## 2023-04-14 NOTE — Progress Notes (Unsigned)
   New Patient Visit   Subjective  Lori Clements is a 27 y.o. female who presents for the following: growths under arms and in groin, present for years, come and go with time. Not previously treated with any prescription medications. Currently using Hibiclens soap. She has not noticed any specific triggers.   Pt has growths that develop under arms and in groin area since she was a teen. She has never been treated for this..   The following portions of the chart were reviewed this encounter and updated as appropriate: medications, allergies, medical history  Review of Systems:  No other skin or systemic complaints except as noted in HPI or Assessment and Plan.  Objective  Well appearing patient in no apparent distress; mood and affect are within normal limits.  A focused examination was performed of the following areas: Under arms and groin  Relevant exam findings are noted in the Assessment and Plan.   Assessment & Plan   HIDRADENITIS SUPPURATIVA- [place] Stage 1/2 Exam: firm nodules with sinus tracts and scars in bilateral axilla  Chronic and persistent condition with duration or expected duration over one year. Condition is bothersome/symptomatic for patient. Currently flared.   Hidradenitis Suppurativa is a chronic; persistent; non-curable, but treatable condition due to abnormal inflamed sweat glands in the body folds (axilla, inframammary, groin, medial thighs), causing recurrent painful draining cysts and scarring. It can be associated with severe scarring acne and cysts; also abscesses and scarring of scalp. The goal is control and prevention of flares, as it is not curable. Scars are permanent and can be thickened. Treatment may include daily use of topical medication and oral antibiotics.  For some cases, Humira or Cosentyx (biologic injections) may be prescribed to decrease the inflammatory process and prevent flares.  When indicated, inflamed cysts may also be treated  surgically.  Treatment Plan: Doxycycline 100mg  bid for 14 days with flare  Clindamycin lotion daily to affected area Recommend continue Hibiclens soap Discussed that Laser Hair Removal may be beneficial in the future  Return in about 6 months (around 10/13/2023).  I, Tillie Fantasia, CMA, am acting as scribe for Gwenith Daily, MD.   Documentation: I have reviewed the above documentation for accuracy and completeness, and I agree with the above.  Gwenith Daily, MD

## 2023-05-09 DIAGNOSIS — G4733 Obstructive sleep apnea (adult) (pediatric): Secondary | ICD-10-CM | POA: Diagnosis not present

## 2023-10-13 ENCOUNTER — Ambulatory Visit: Payer: BC Managed Care – PPO | Admitting: Dermatology

## 2024-08-20 ENCOUNTER — Ambulatory Visit: Admitting: Family Medicine
